# Patient Record
Sex: Female | Born: 1975 | Race: White | Hispanic: No | Marital: Married | Smoking: Former smoker
Health system: Southern US, Community
[De-identification: ages and names within clinical notes are randomized; demographics above are authoritative.]

## PROBLEM LIST (undated history)

## (undated) DIAGNOSIS — F411 Generalized anxiety disorder: Secondary | ICD-10-CM

## (undated) DIAGNOSIS — D649 Anemia, unspecified: Secondary | ICD-10-CM

## (undated) DIAGNOSIS — G47419 Narcolepsy without cataplexy: Secondary | ICD-10-CM

## (undated) DIAGNOSIS — F32A Depression, unspecified: Secondary | ICD-10-CM

## (undated) DIAGNOSIS — T7840XA Allergy, unspecified, initial encounter: Secondary | ICD-10-CM

## (undated) DIAGNOSIS — F329 Major depressive disorder, single episode, unspecified: Secondary | ICD-10-CM

## (undated) DIAGNOSIS — G4761 Periodic limb movement disorder: Secondary | ICD-10-CM

## (undated) DIAGNOSIS — M1990 Unspecified osteoarthritis, unspecified site: Secondary | ICD-10-CM

## (undated) HISTORY — DX: Generalized anxiety disorder: F41.1

## (undated) HISTORY — DX: Periodic limb movement disorder: G47.61

## (undated) HISTORY — DX: Depression, unspecified: F32.A

## (undated) HISTORY — PX: DILATION AND CURETTAGE OF UTERUS: SHX78

## (undated) HISTORY — DX: Anemia, unspecified: D64.9

## (undated) HISTORY — DX: Allergy, unspecified, initial encounter: T78.40XA

## (undated) HISTORY — DX: Unspecified osteoarthritis, unspecified site: M19.90

## (undated) HISTORY — PX: ROOT CANAL: SHX2363

## (undated) HISTORY — DX: Major depressive disorder, single episode, unspecified: F32.9

## (undated) HISTORY — DX: Narcolepsy without cataplexy: G47.419

## (undated) HISTORY — PX: NASAL SEPTUM SURGERY: SHX37

---

## 2011-01-17 ENCOUNTER — Encounter: Payer: Self-pay | Admitting: Family Medicine

## 2016-02-14 ENCOUNTER — Telehealth: Payer: Self-pay | Admitting: Behavioral Health

## 2016-02-14 NOTE — Telephone Encounter (Signed)
Unable to reach patient at time of Pre-Visit Call.  Left message for patient to return call when available.    

## 2016-02-15 ENCOUNTER — Ambulatory Visit (INDEPENDENT_AMBULATORY_CARE_PROVIDER_SITE_OTHER): Payer: BLUE CROSS/BLUE SHIELD | Admitting: Family Medicine

## 2016-02-15 ENCOUNTER — Encounter: Payer: Self-pay | Admitting: Family Medicine

## 2016-02-15 VITALS — BP 110/60 | HR 73 | Temp 98.2°F | Ht 65.0 in | Wt 133.6 lb

## 2016-02-15 DIAGNOSIS — Z13220 Encounter for screening for lipoid disorders: Secondary | ICD-10-CM | POA: Diagnosis not present

## 2016-02-15 DIAGNOSIS — Z7689 Persons encountering health services in other specified circumstances: Secondary | ICD-10-CM

## 2016-02-15 DIAGNOSIS — Z7189 Other specified counseling: Secondary | ICD-10-CM | POA: Diagnosis not present

## 2016-02-15 DIAGNOSIS — R002 Palpitations: Secondary | ICD-10-CM

## 2016-02-15 DIAGNOSIS — F411 Generalized anxiety disorder: Secondary | ICD-10-CM

## 2016-02-15 DIAGNOSIS — Z131 Encounter for screening for diabetes mellitus: Secondary | ICD-10-CM

## 2016-02-15 HISTORY — DX: Generalized anxiety disorder: F41.1

## 2016-02-15 LAB — TSH: TSH: 1.48 u[IU]/mL (ref 0.35–4.50)

## 2016-02-15 LAB — COMPREHENSIVE METABOLIC PANEL
ALK PHOS: 41 U/L (ref 39–117)
ALT: 10 U/L (ref 0–35)
AST: 13 U/L (ref 0–37)
Albumin: 4.3 g/dL (ref 3.5–5.2)
BILIRUBIN TOTAL: 0.3 mg/dL (ref 0.2–1.2)
BUN: 10 mg/dL (ref 6–23)
CO2: 30 mEq/L (ref 19–32)
Calcium: 8.9 mg/dL (ref 8.4–10.5)
Chloride: 103 mEq/L (ref 96–112)
Creatinine, Ser: 0.7 mg/dL (ref 0.40–1.20)
GFR: 98.02 mL/min (ref >60.00)
GLUCOSE: 86 mg/dL (ref 70–99)
Potassium: 4.2 mEq/L (ref 3.5–5.1)
SODIUM: 138 meq/L (ref 135–145)
TOTAL PROTEIN: 7.3 g/dL (ref 6.0–8.3)

## 2016-02-15 LAB — CBC
HCT: 35.3 % — ABNORMAL LOW (ref 36.0–46.0)
Hemoglobin: 12 g/dL (ref 12.0–15.0)
MCHC: 33.9 g/dL (ref 30.0–36.0)
MCV: 89.2 fl (ref 78.0–100.0)
Platelets: 256 10*3/uL (ref 150.0–400.0)
RBC: 3.96 Mil/uL (ref 3.87–5.11)
RDW: 13.3 % (ref 11.5–15.5)
WBC: 5.8 10*3/uL (ref 4.0–10.5)

## 2016-02-15 LAB — LIPID PANEL
Cholesterol: 151 mg/dL (ref 0–200)
HDL: 57 mg/dL (ref >39.00)
LDL Cholesterol: 81 mg/dL (ref 0–99)
NONHDL: 94.09
Total CHOL/HDL Ratio: 3
Triglycerides: 63 mg/dL (ref 0.0–149.0)
VLDL: 12.6 mg/dL (ref 0.0–40.0)

## 2016-02-15 LAB — HEMOGLOBIN A1C: Hgb A1c MFr Bld: 5.5 % (ref 4.6–6.5)

## 2016-02-15 MED ORDER — ESCITALOPRAM OXALATE 20 MG PO TABS: 20.0000 mg | ORAL_TABLET | Freq: Every day | ORAL | Status: DC

## 2016-02-15 NOTE — Patient Instructions (Addendum)
It was very nice to meet you today I will be in touch with your labs asap Take care and let me know if you have any changes in your palpitations but I suspect they are nothing to worry about - your EKG looks just fine.

## 2016-02-15 NOTE — Progress Notes (Signed)
Pre visit review using our clinic tool,if applicable. No additional management support is needed unless otherwise documented below in the visit note.  

## 2016-02-15 NOTE — Progress Notes (Signed)
[place] at [facility] [address] [place], [place] [address] [phone] [phone]  Date:  02/15/2016   Name:  Elizabeth [name]   DOB:  06/15/1976   MRN:  [id number]  PCP:  Lamar Blinks, MD    Chief Complaint: No chief complaint on file.   History of Present Illness:  Elizabeth [name] is a 40 y.o. very pleasant female patient who presents with the following:  Here today to establish care.  They moved to this area about one year ago.  Had lived in [place] until last year.  She is generally in good health She does uses alprazolam, wellbutrin and lexapro for anxiety.   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   She is a Pharmacist, hospital. Her children are 28 and 44 yo.   At the current time her anxiety regimen is working well for her.  She does not really like taking medications but recognized  She uses the xanax on a prn basis- notes that she will use it once a week or so  She has noted some occasional heart palpitations - they will last for a few seconds.  Does not occur every day No fainting or chest pain.  Non exertional No history of any heart problems known Her LMP was about 2 weeks ago.   There are no active problems to display for this patient.   Past Medical History  Diagnosis Date  . Depression   . Allergy   . Anemia   . Arthritis     Past Surgical History  Procedure Laterality Date  . Cesarean section      X2  . Dilation and curettage of uterus      Social History  Substance Use Topics  . Smoking status: Never Smoker   . Smokeless tobacco: Never Used  . Alcohol Use: No    Family History  Problem Relation Age of Onset  . Heart disease Mother   . Diabetes Father     Allergies  Allergen Reactions  . Sulfa Antibiotics     Medication list has been reviewed and updated.  No  current outpatient prescriptions on file prior to visit.   No current facility-administered medications on file prior to visit.    Review of Systems:  As per HPI- otherwise negative.   Physical Examination: Filed Vitals:   02/15/16 1034  Pulse: 73  Temp: 98.2 F (36.8 C)   Filed Vitals:   02/15/16 1034  Height: 5\' 5"  (1.651 m)  Weight: 133 lb 9.6 oz (60.601 kg)   Body mass index is 22.23 kg/(m^2). Ideal Body Weight: Weight in (lb) to have BMI = 25: 149.9  GEN: WDWN, NAD, Non-toxic, A & O x 3, looks well, normal weight  HEENT: Atraumatic, Normocephalic. Neck supple. No masses, No LAD.  Bilateral TM wnl, oropharynx normal.  PEERL,EOMI.   Ears and Nose: No external deformity. CV: RRR, No M/G/R. No JVD. No thrill. No extra heart sounds. PULM: CTA B, no wheezes, crackles, rhonchi. No retractions. No resp. distress. No accessory muscle use. ABD: S, NT, ND. No rebound. No HSM. EXTR: No c/c/e NEURO Normal gait.  PSYCH: Normally interactive. Conversant. Not depressed or anxious appearing.  Calm demeanor.   EKG: normal sinus rhythm Assessment and Plan: Palpitations - Plan: EKG 12-Lead, CBC, TSH  Establishing care  with new doctor, encounter for  Screening for hyperlipidemia - Plan: Lipid panel  Screening for diabetes mellitus - Plan: Comprehensive metabolic panel, Hemoglobin A1c  Anxiety state - Plan: escitalopram (LEXAPRO) 20 MG tablet  Here today to establish care Refilled her lexapro- reassured that her regimen is appropriate to use and may be continued as long as she feels it is necessary.  Suspect she is having occasional PACs or PVCs.  Reassured that her EKG is normal.  If she has any more persistent sx she will seek care Screening labs as above- Will plan further follow- up pending labs.   Signed Lamar Blinks, MD

## 2016-02-16 ENCOUNTER — Encounter: Payer: Self-pay | Admitting: Family Medicine

## 2016-03-28 ENCOUNTER — Encounter: Payer: Self-pay | Admitting: Family Medicine

## 2016-03-28 ENCOUNTER — Ambulatory Visit (INDEPENDENT_AMBULATORY_CARE_PROVIDER_SITE_OTHER): Payer: BLUE CROSS/BLUE SHIELD | Admitting: Family Medicine

## 2016-03-28 VITALS — BP 120/63 | HR 63 | Temp 98.2°F | Ht 65.0 in | Wt 134.4 lb

## 2016-03-28 DIAGNOSIS — J3489 Other specified disorders of nose and nasal sinuses: Secondary | ICD-10-CM | POA: Diagnosis not present

## 2016-03-28 MED ORDER — PREDNISONE 20 MG PO TABS: ORAL_TABLET | ORAL | Status: DC

## 2016-03-28 MED ORDER — AMOXICILLIN 500 MG PO CAPS: 1000.0000 mg | ORAL_CAPSULE | Freq: Two times a day (BID) | ORAL | Status: DC

## 2016-03-28 NOTE — Patient Instructions (Signed)
We are going to treat you for sinus inflammation and pain with prednisone - use as directed.  While on prednisone do not take ibuprofen/ aleve, but tylenol is ok!    If you feel like your symptoms are not improving in the next 2-3 days go ahead and start the amoxicillin antibiotic as well Let me know if you have any fevers or other sign of worsening illness, or if you are not much improved within about one week

## 2016-03-28 NOTE — Progress Notes (Signed)
[place] at [facility] [address] [place], [place] [address] [phone]  Date:  03/28/2016   Name:  Elizabeth [name]   DOB:  1976-03-23   MRN:  [id number]  PCP:  Lamar Blinks, MD    Chief Complaint: Sinus Problem   History of Present Illness:  Elizabeth [name] is a 40 y.o. very pleasant female patient who presents with the following:  She has noted sinus congestion this week and does tend to have seasonal allergies this time of year at baseline.  Starting yesterday she noted "waves of pain" in her sinuses with pressure in her face and in her teeth.  The pain would come and go Last night she took an allergy pill (non- decongestant) and some ibuprofen; this finally did seem to help her get to sleep.  However today the pain and pressure in her sinuses continues   LMP 5/10  She has not noted any ST.  Her ears can be painful.  She has not noted any fever, chills or aches, she is not coughing No GI symptoms  No suspicion of pregnancy Her youngest daughter had a stomach bug recently but otherwise no exposure to illness    Patient Active Problem List   Diagnosis Date Noted  . Anxiety state 02/15/2016    Past Medical History  Diagnosis Date  . Depression   . Allergy   . Anemia   . Arthritis   . Anxiety state 02/15/2016    Past Surgical History  Procedure Laterality Date  . Cesarean section      X2  . Dilation and curettage of uterus      Social History  Substance Use Topics  . Smoking status: Never Smoker   . Smokeless tobacco: Never Used  . Alcohol Use: No    Family History  Problem Relation Age of Onset  . Heart disease Mother   . Diabetes Father     Allergies  Allergen Reactions  . Sulfa Antibiotics     Medication list has been reviewed and updated.  Current Outpatient Prescriptions on File Prior to Visit  Medication Sig Dispense Refill  . ALPRAZolam (NIRAVAM) 0.25 MG dissolvable tablet Take 0.25  mg by mouth 2 (two) times daily as needed for anxiety.    Mar[place] Kitchen buPROPion (ZYBAN) 150 MG 12 hr tablet Take 150 mg by mouth daily.    Marland Kitchen escitalopram (LEXAPRO) 20 MG tablet Take 1 tablet (20 mg total) by mouth daily. 90 tablet 3   No current facility-administered medications on file prior to visit.    Review of Systems:  As per HPI- otherwise negative. No history of DM or of mania  Physical Examination: Filed Vitals:   03/28/16 1100  BP: 120/63  Pulse: 63  Temp: 98.2 F (36.8 C)   Filed Vitals:   03/28/16 1100  Height: 5\' 5"  (1.651 m)  Weight: 134 lb 6.4 oz (60.963 kg)   Body mass index is 22.37 kg/(m^2). Ideal Body Weight: Weight in (lb) to have BMI = 25: 149.9  GEN: WDWN, NAD, Non-toxic, A & O x 3, looks well HEENT: Atraumatic, Normocephalic. Neck supple. No masses, No LAD.  Bilateral TM wnl, oropharynx normal.  PEERL,EOMI.   Sinuses are tender to percussion, nasal congestion is present Ears and Nose: No external deformity. CV: RRR, No M/G/R. No JVD. No thrill. No extra heart sounds. PULM: CTA B, no wheezes, crackles, rhonchi. No retractions. No resp. distress. No accessory muscle use. EXTR: No  c/c/e NEURO Normal gait.  PSYCH: Normally interactive. Conversant. Not depressed or anxious appearing.  Calm demeanor.    Assessment and Plan: Sinus pressure - Plan: predniSONE (DELTASONE) 20 MG tablet, amoxicillin (AMOXIL) 500 MG capsule  Treat for sinus pressure and pain with a short course of prednsione If this is not helpful she will start amoxicillin in a few days See patient instructions for more details.     Signed Lamar Blinks, MD

## 2016-03-28 NOTE — Progress Notes (Signed)
Pre visit review using our clinic review tool, if applicable. No additional management support is needed unless otherwise documented below in the visit note. 

## 2016-05-20 ENCOUNTER — Telehealth: Payer: Self-pay

## 2016-05-20 ENCOUNTER — Other Ambulatory Visit: Payer: Self-pay | Admitting: Emergency Medicine

## 2016-05-20 DIAGNOSIS — F411 Generalized anxiety disorder: Secondary | ICD-10-CM

## 2016-05-20 NOTE — Telephone Encounter (Signed)
Patient need refill for Zyban, patient needs rx for 90 days so insurance will cover Pharmacy is [facility] [address] [phone]

## 2016-05-21 MED ORDER — BUPROPION HCL ER (XL) 150 MG PO TB24: 150.0000 mg | ORAL_TABLET | Freq: Every day | ORAL | 3 refills | Status: DC

## 2016-05-21 NOTE — Telephone Encounter (Signed)
Received refill request for Zyban. Pt last seen 03/28/16. Is it ok to refill? Please advise. Thanks.

## 2016-05-21 NOTE — Telephone Encounter (Signed)
Called pt- she is taking a 150mg  dose just once a day.  Likely needs to be changed to the 24 hour sustained release.  Will change this for her and refill

## 2016-09-18 ENCOUNTER — Ambulatory Visit (INDEPENDENT_AMBULATORY_CARE_PROVIDER_SITE_OTHER): Payer: BLUE CROSS/BLUE SHIELD | Admitting: Podiatry

## 2016-09-18 ENCOUNTER — Encounter: Payer: Self-pay | Admitting: Podiatry

## 2016-09-18 VITALS — BP 123/79 | HR 70 | Ht 65.0 in | Wt 133.0 lb

## 2016-09-18 DIAGNOSIS — M79673 Pain in unspecified foot: Secondary | ICD-10-CM

## 2016-09-18 DIAGNOSIS — M21969 Unspecified acquired deformity of unspecified lower leg: Secondary | ICD-10-CM

## 2016-09-18 DIAGNOSIS — M7741 Metatarsalgia, right foot: Secondary | ICD-10-CM

## 2016-09-18 NOTE — Patient Instructions (Signed)
Seen for painful toe 4th right. Noted of abnormal, weak first metatarsal bone and abnormal weight shifting to lateral column and causing lesser toes to curl. May benefit from custom orthotics, lace up tennis shoes and metatarsal binder. Metatarsal binder 1 pair (sm) dispensed. Return for custom orthotics.

## 2016-09-18 NOTE — Progress Notes (Signed)
SUBJECTIVE: 40 y.o. year old female presents complaining of pain off and on both feet with a bump in 4th toe right foot.  Sometimes lesser toes go numb when in high heel shoes. Has lowe back arthritis issues. Usually have multiple joint problems.  Moved from [place] 18 months ago and seen a podiatrist in [place].   REVIEW OF SYSTEMS: A comprehensive review of systems was negative.  OBJECTIVE: DERMATOLOGIC EXAMINATION: No abnormal findings. VASCULAR EXAMINATION OF LOWER LIMBS: All pedal pulses are palpable with normal pulsation.  Capillary Filling times within 3 seconds in all digits.  Temperature gradient from tibial crest to dorsum of foot is within normal bilateral. NEUROLOGIC EXAMINATION OF THE LOWER LIMBS: All epicritic and tactile sensations grossly intact. MUSCULOSKELETAL EXAMINATION: Positive for excess sagittal plane motion of the first ray bilateral. Contracted 4th digit at DIPJ with enlarged condyle.  ASSESSMENT: Painful feet. Hypermobile first ray bilateral. Lesser metatarsalgia bilateral.  PLAN: Reviewed clinical findings and available treatment options. Metatarsal binder dispensed x 1, small for right foot. May benefit from custom orthotics.

## 2016-09-20 DIAGNOSIS — M7741 Metatarsalgia, right foot: Secondary | ICD-10-CM | POA: Insufficient documentation

## 2016-10-10 ENCOUNTER — Ambulatory Visit (INDEPENDENT_AMBULATORY_CARE_PROVIDER_SITE_OTHER): Payer: BLUE CROSS/BLUE SHIELD | Admitting: Family Medicine

## 2016-10-10 ENCOUNTER — Encounter: Payer: Self-pay | Admitting: Family Medicine

## 2016-10-10 VITALS — BP 126/70 | HR 64 | Temp 98.3°F | Ht 65.0 in | Wt 133.8 lb

## 2016-10-10 DIAGNOSIS — J0111 Acute recurrent frontal sinusitis: Secondary | ICD-10-CM

## 2016-10-10 MED ORDER — AMOXICILLIN-POT CLAVULANATE 875-125 MG PO TABS: 1.0000 | ORAL_TABLET | Freq: Two times a day (BID) | ORAL | 0 refills | Status: DC

## 2016-10-10 NOTE — Progress Notes (Signed)
Pre visit review using our clinic review tool, if applicable. No additional management support is needed unless otherwise documented below in the visit note. 

## 2016-10-10 NOTE — Patient Instructions (Signed)
We are going to use augmentin for your sinus infection.  Take this twice a day for 7- 10 days; let me know if you are not getting better in the next couple of days., Sooner if worse.

## 2016-10-10 NOTE — Progress Notes (Signed)
[place] at [facility] [address], [place], [place] [address] [phone] [id number] [phone]  Date:  10/10/2016   Name:  Elizabeth [name]   DOB:  09-11-1976   MRN:  [id number]  PCP:  Lamar Blinks, MD    Chief Complaint: Sinusitis (c/o sinus pressure and pain. pt states that she also has been in her teeth. sx's have been present for about 3-4 days, worse today. )   History of Present Illness:  Elizabeth [name] is a 40 y.o. very pleasant female patient who presents with the following:  Seen here in June with sinus pressure- we treated her with prednisone and gave an rx to hold of  amoxicillin- however she did not end up using the amox as her dentist gave her augmentin. The prednisone did not help, but the augmentin did help.  She was having a lot of tooth pain so she went to her dentist who did x-rays and noted severe sinusitis, treated her with augmetin  She is here today with a sick visit- she states that she has "the same exact thing that I came for last time." She has been ill for 3-4 days with sinus pressure, tooth pain, facial pain, headahce and toothache No chest sx No fever or chills.  Slight ST No GI symptoms  Minimal cough  LMP 2 weeks ago approx   Patient Active Problem List   Diagnosis Date Noted  . Metatarsalgia of right foot 09/20/2016  . Anxiety state 02/15/2016    Past Medical History:  Diagnosis Date  . Allergy   . Anemia   . Anxiety state 02/15/2016  . Arthritis   . Depression     Past Surgical History:  Procedure Laterality Date  . CESAREAN SECTION     X2  . DILATION AND CURETTAGE OF UTERUS      Social History  Substance Use Topics  . Smoking status: Former Smoker    Quit date: 10/21/2004  . Smokeless tobacco: Never Used     Comment: quit 11 yrs ago  . Alcohol use No    Family History  Problem Relation Age of Onset  . Heart disease Mother   . Diabetes Father     Allergies  Allergen Reactions  . Sulfa  Antibiotics     Medication list has been reviewed and updated.  Current Outpatient Prescriptions on File Prior to Visit  Medication Sig Dispense Refill  . buPROPion (WELLBUTRIN XL) 150 MG 24 hr tablet Take 1 tablet (150 mg total) by mouth daily. 90 tablet 3  . escitalopram (LEXAPRO) 20 MG tablet Take 1 tablet (20 mg total) by mouth daily. 90 tablet 3   No current facility-administered medications on file prior to visit.     Review of Systems:  As per HPI- otherwise negative.  No CP or SOB She is otherwise in good health    Physical Examination: Vitals:   10/10/16 1818  BP: 126/70  Pulse: 64  Temp: 98.3 F (36.8 C)   Vitals:   10/10/16 1818  Weight: 133 lb 12.8 oz (60.7 kg)  Height: 5\' 5"  (1.651 m)   Body mass index is 22.27 kg/m. Ideal Body Weight: Weight in (lb) to have BMI = 25: 149.9  GEN: WDWN, NAD, Non-toxic, A & O x 3, looks well HEENT: Atraumatic, Normocephalic. Neck supple. No masses, No LAD.  Bilateral TM wnl, oropharynx normal.  PEERL,EOMI.   Tender in her frontal sinuses., nasal cavity is congested with purulent discharge  Ears and Nose: No external deformity. CV: RRR, No M/G/R. No JVD. No thrill. No extra heart sounds. PULM: CTA B, no wheezes, crackles, rhonchi. No retractions. No resp. distress. No accessory muscle use. EXTR: No c/c/e NEURO Normal gait.  PSYCH: Normally interactive. Conversant. Not depressed or anxious appearing.  Calm demeanor.    Assessment and Plan: Acute recurrent frontal sinusitis - Plan: amoxicillin-clavulanate (AUGMENTIN) 875-125 MG tablet  Here today with recurrent sinusitis Treat with augmentin BID for 7- 10 days She will let me know if not feeling better in the next few days   Signed Lamar Blinks, MD

## 2016-10-25 ENCOUNTER — Encounter: Payer: Self-pay | Admitting: Family Medicine

## 2016-10-25 ENCOUNTER — Ambulatory Visit (INDEPENDENT_AMBULATORY_CARE_PROVIDER_SITE_OTHER): Payer: BLUE CROSS/BLUE SHIELD | Admitting: Family Medicine

## 2016-10-25 VITALS — BP 118/76 | HR 78 | Temp 98.2°F | Ht 65.0 in | Wt 138.2 lb

## 2016-10-25 DIAGNOSIS — N3001 Acute cystitis with hematuria: Secondary | ICD-10-CM | POA: Diagnosis not present

## 2016-10-25 LAB — POC URINALSYSI DIPSTICK (AUTOMATED)
BILIRUBIN UA: NEGATIVE
Blood, UA: POSITIVE
GLUCOSE UA: NEGATIVE
KETONES UA: NEGATIVE
LEUKOCYTES UA: NEGATIVE
NITRITE UA: NEGATIVE
Protein, UA: NEGATIVE
Spec Grav, UA: >=1.03
Urobilinogen, UA: 0.2
pH, UA: 6

## 2016-10-25 MED ORDER — NITROFURANTOIN MONOHYD MACRO 100 MG PO CAPS: 100.0000 mg | ORAL_CAPSULE | Freq: Two times a day (BID) | ORAL | 0 refills | Status: DC

## 2016-10-25 NOTE — Addendum Note (Signed)
Addended by: McDonald Cellar on: 10/25/2016 02:56 PM   Modules accepted: Orders

## 2016-10-25 NOTE — Progress Notes (Signed)
Pre visit review using our clinic review tool, if applicable. No additional management support is needed unless otherwise documented below in the visit note. 

## 2016-10-25 NOTE — Progress Notes (Signed)
Chief Complaint  Patient presents with  . Cystitis    Pt states she thinks she has a bladder infection /Pt reports bleeding after menstration and uncomforable after intercourse    Elizabeth [name] is a 41 y.o. female here for possible UTI.  Duration: 1 day. Symptoms: urinary frequency, hematuria and pain with urination. Denies: fever, nausea, vomiting and flank pain, vaginal discharge Hx of recurrent UTI? No Denies new sexual partners. Has had UTI's before, states this is the similar presentation  ROS:  Constitutional: denies fever GU: As noted in HPI MSK: Denies back pain Abd: Denies constipation or abdominal pain  Past Medical History:  Diagnosis Date  . Allergy   . Anemia   . Anxiety state 02/15/2016  . Arthritis   . Depression    Family History  Problem Relation Age of Onset  . Heart disease Mother   . Diabetes Father    Social History   Social History  . Marital status: Married   Social History Main Topics  . Smoking status: Former Smoker    Quit date: 10/21/2004  . Smokeless tobacco: Never Used     Comment: quit 11 yrs ago  . Alcohol use No  . Drug use: Unknown    BP 118/76 (BP Location: Right Arm, Patient Position: Sitting, Cuff Size: Small)   Pulse 78   Temp 98.2 F (36.8 C) (Oral)   Ht 5\' 5"  (1.651 m)   Wt 138 lb 3.2 oz (62.7 kg)   LMP 10/11/2016   SpO2 98%   BMI 23.00 kg/m  General: Awake, alert, appears stated age 63: MMM Heart: RRR, no murmurs Lungs: CTAB, normal respiratory effort, no accessory muscle usage Abd: BS+, soft, NT, ND, no masses or organomegaly MSK: No CVA tenderness, neg Lloyd's sign Psych: Age appropriate judgment and insight  Acute cystitis with hematuria - Plan: nitrofurantoin, macrocrystal-monohydrate, (MACROBID) 100 MG capsule  Orders as above. UA not quite suggestive of infection, will treat given her hx of previous UTI's presenting like this. Will culture for completeness. F/u in 3 days if symptoms fail to improve,  sooner if things worsen. The patient voiced understanding and agreement to the plan.  Hodgeman, DO 10/25/16 2:52 PM

## 2016-11-21 DIAGNOSIS — J329 Chronic sinusitis, unspecified: Secondary | ICD-10-CM | POA: Diagnosis not present

## 2016-11-21 DIAGNOSIS — J342 Deviated nasal septum: Secondary | ICD-10-CM | POA: Diagnosis not present

## 2016-11-27 ENCOUNTER — Other Ambulatory Visit: Payer: Self-pay | Admitting: Otolaryngology

## 2016-11-27 DIAGNOSIS — J329 Chronic sinusitis, unspecified: Secondary | ICD-10-CM

## 2016-11-28 DIAGNOSIS — L509 Urticaria, unspecified: Secondary | ICD-10-CM | POA: Diagnosis not present

## 2016-11-28 DIAGNOSIS — R208 Other disturbances of skin sensation: Secondary | ICD-10-CM | POA: Diagnosis not present

## 2016-12-11 ENCOUNTER — Ambulatory Visit
Admission: RE | Admit: 2016-12-11 | Discharge: 2016-12-11 | Disposition: A | Discharge status: Home / Self Care | Payer: BLUE CROSS/BLUE SHIELD | Source: Ambulatory Visit | Attending: Otolaryngology | Admitting: Otolaryngology

## 2016-12-11 DIAGNOSIS — J329 Chronic sinusitis, unspecified: Secondary | ICD-10-CM

## 2016-12-25 DIAGNOSIS — J342 Deviated nasal septum: Secondary | ICD-10-CM | POA: Diagnosis not present

## 2017-02-06 ENCOUNTER — Encounter: Payer: Self-pay | Admitting: Family Medicine

## 2017-02-06 ENCOUNTER — Ambulatory Visit (INDEPENDENT_AMBULATORY_CARE_PROVIDER_SITE_OTHER): Payer: BLUE CROSS/BLUE SHIELD | Admitting: Family Medicine

## 2017-02-06 VITALS — BP 124/78 | HR 73 | Temp 98.5°F | Ht 65.0 in | Wt 135.8 lb

## 2017-02-06 DIAGNOSIS — Z130 Encounter for screening for diseases of the blood and blood-forming organs and certain disorders involving the immune mechanism: Secondary | ICD-10-CM | POA: Diagnosis not present

## 2017-02-06 DIAGNOSIS — N898 Other specified noninflammatory disorders of vagina: Secondary | ICD-10-CM | POA: Diagnosis not present

## 2017-02-06 DIAGNOSIS — R5382 Chronic fatigue, unspecified: Secondary | ICD-10-CM | POA: Diagnosis not present

## 2017-02-06 DIAGNOSIS — G4710 Hypersomnia, unspecified: Secondary | ICD-10-CM

## 2017-02-06 LAB — COMPREHENSIVE METABOLIC PANEL
ALBUMIN: 4.3 g/dL (ref 3.5–5.2)
ALT: 8 U/L (ref 0–35)
AST: 13 U/L (ref 0–37)
Alkaline Phosphatase: 44 U/L (ref 39–117)
BUN: 10 mg/dL (ref 6–23)
CHLORIDE: 103 meq/L (ref 96–112)
CO2: 31 mEq/L (ref 19–32)
Calcium: 9 mg/dL (ref 8.4–10.5)
Creatinine, Ser: 0.77 mg/dL (ref 0.40–1.20)
GFR: 87.4 mL/min (ref >60.00)
Glucose, Bld: 88 mg/dL (ref 70–99)
POTASSIUM: 3.8 meq/L (ref 3.5–5.1)
Sodium: 140 mEq/L (ref 135–145)
Total Bilirubin: 0.3 mg/dL (ref 0.2–1.2)
Total Protein: 7 g/dL (ref 6.0–8.3)

## 2017-02-06 LAB — CBC
HCT: 34.2 % — ABNORMAL LOW (ref 36.0–46.0)
Hemoglobin: 11.7 g/dL — ABNORMAL LOW (ref 12.0–15.0)
MCHC: 34.1 g/dL (ref 30.0–36.0)
MCV: 91.1 fl (ref 78.0–100.0)
PLATELETS: 258 10*3/uL (ref 150.0–400.0)
RBC: 3.76 Mil/uL — AB (ref 3.87–5.11)
RDW: 13.2 % (ref 11.5–15.5)
WBC: 5.8 10*3/uL (ref 4.0–10.5)

## 2017-02-06 LAB — TSH: TSH: 1.33 u[IU]/mL (ref 0.35–4.50)

## 2017-02-06 LAB — FOLLICLE STIMULATING HORMONE: FSH: 66 m[IU]/mL

## 2017-02-06 LAB — VITAMIN D 25 HYDROXY (VIT D DEFICIENCY, FRACTURES): VITD: 25.96 ng/mL — ABNORMAL LOW (ref 30.00–100.00)

## 2017-02-06 NOTE — Progress Notes (Addendum)
[place] at [facility] [address] [place], [place] [address] [phone]  Date:  02/06/2017   Name:  Elizabeth [name]   DOB:  05/06/1976   MRN:  [id number]  PCP:  Lamar Blinks, MD    Chief Complaint: Fatigue (c/o feeling fatigued. pt states that sx's have been present for a long time but worse this past week. )   History of Present Illness:  Elizabeth [name] is a 41 y.o. very pleasant female patient who presents with the following:  Last seen by myself in December with sinusitis Otherwise she is generally healthy. Here today with concern of increasing fatigue; worse for the last 4-5 months.  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  They are not sure if this might be contributing to her fatigue.  They will do this surgery in June.    Her most recent labs were back in 4/17 She has not had a sleep study  She gets up around 0600, 4-5 hours later she will feel very tired and needs a nap.  Then she will be able to go another 5-6 hours. She may take another nap in the afternoon sometimes.  She will get to bed around 10- 11 pm  She does snore but not badly according to her husband She notes that even after coffee she does not feel awake in the morning She does not so much think that depression is her issue.  No chance of pregnancy- her last menses were about a month ago She does notice some vaginal dryness and wonders if she might have a hormonal issue- ?early menopause  Patient Active Problem List   Diagnosis Date Noted  . Metatarsalgia of right foot 09/20/2016  . Anxiety state 02/15/2016    Past Medical History:  Diagnosis Date  . Allergy   . Anemia   . Anxiety state 02/15/2016  .  Arthritis   . Depression     Past Surgical History:  Procedure Laterality Date  . CESAREAN SECTION     X2  . DILATION AND CURETTAGE OF UTERUS      Social History  Substance Use Topics  . Smoking status: Former Smoker    Quit date: 10/21/2004  . Smokeless tobacco: Never Used     Comment: quit 11 yrs ago  . Alcohol use No    Family History  Problem Relation Age of Onset  . Heart disease Mother   . Diabetes Father     Allergies  Allergen Reactions  . Sulfa Antibiotics     Medication list has been reviewed and updated.  Current Outpatient Prescriptions on File Prior to Visit  Medication Sig Dispense Refill  . buPROPion (WELLBUTRIN XL) 150 MG 24 hr tablet Take 1 tablet (150 mg total) by mouth daily. 90 tablet 3  . escitalopram (LEXAPRO) 20 MG tablet Take 1 tablet (20 mg total) by mouth daily. 90 tablet 3   No current facility-administered medications on file prior to visit.     Review of Systems:  As per HPI- otherwise negative.   Physical Examination: Vitals:   02/06/17 1402  BP: 124/78  Pulse: 73  Temp: 98.5 F (36.9 C)  Vitals:   02/06/17 1402  Weight: 135 lb 12.8 oz (61.6 kg)  Height: 5\' 5"  (1.651 m)   Body mass index is 22.6 kg/m. Ideal Body Weight: Weight in (lb) to have BMI = 25: 149.9  GEN: WDWN, NAD, Non-toxic, A & O x 3, normal weight, looks well HEENT: Atraumatic, Normocephalic. Neck supple. No masses, No LAD.  Bilateral TM wnl, oropharynx normal.  PEERL,EOMI.   Ears and Nose: No external deformity. CV: RRR, No M/G/R. No JVD. No thrill. No extra heart sounds. PULM: CTA B, no wheezes, crackles, rhonchi. No retractions. No resp. distress. No accessory muscle use. ABD: S, NT, ND, +BS. No rebound. No HSM. EXTR: No c/c/e NEURO Normal gait.  PSYCH: Normally interactive. Conversant. Not depressed or anxious appearing.  Calm demeanor.    Assessment and Plan: Excessive sleepiness - Plan: Ambulatory referral to Neurology  Screening for  deficiency anemia - Plan: CBC  Chronic fatigue - Plan: Comprehensive metabolic panel, TSH, Vitamin D (25 hydroxy)  Vaginal dryness - Plan: FSH here today with complaint of excessive sleepiness. She is otherwise in good health Will obtain labs as above but suspect this may be a sleep disorder Referral to neurology placed as well    Signed Lamar Blinks, MD  Received her labs  Results for orders placed or performed in visit on 02/06/17  CBC  Result Value Ref Range   WBC 5.8 4.0 - 10.5 K/uL   RBC 3.76 (L) 3.87 - 5.11 Mil/uL   Platelets 258.0 150.0 - 400.0 K/uL   Hemoglobin 11.7 (L) 12.0 - 15.0 g/dL   HCT 34.2 (L) 36.0 - 46.0 %   MCV 91.1 78.0 - 100.0 fl   MCHC 34.1 30.0 - 36.0 g/dL   RDW 13.2 11.5 - 15.5 %  Comprehensive metabolic panel  Result Value Ref Range   Sodium 140 135 - 145 mEq/L   Potassium 3.8 3.5 - 5.1 mEq/L   Chloride 103 96 - 112 mEq/L   CO2 31 19 - 32 mEq/L   Glucose, Bld 88 70 - 99 mg/dL   BUN 10 6 - 23 mg/dL   Creatinine, Ser 0.77 0.40 - 1.20 mg/dL   Total Bilirubin 0.3 0.2 - 1.2 mg/dL   Alkaline Phosphatase 44 39 - 117 U/L   AST 13 0 - 37 U/L   ALT 8 0 - 35 U/L   Total Protein 7.0 6.0 - 8.3 g/dL   Albumin 4.3 3.5 - 5.2 g/dL   Calcium 9.0 8.4 - 10.5 mg/dL   GFR 87.40 >60.00 mL/min  TSH  Result Value Ref Range   TSH 1.33 0.35 - 4.50 uIU/mL  Vitamin D (25 hydroxy)  Result Value Ref Range   VITD 25.96 (L) 30.00 - 100.00 ng/mL  [facility]  Result Value Ref Range   FSH 66.0 mIU/ML

## 2017-02-06 NOTE — Patient Instructions (Signed)
I am going to have you see neurology for evaluation of excessive sleepiness. We will also get labs today to look for any potential cause of your fatigue

## 2017-02-06 NOTE — Progress Notes (Signed)
Pre visit review using our clinic review tool, if applicable. No additional management support is needed unless otherwise documented below in the visit note. 

## 2017-02-20 ENCOUNTER — Encounter: Payer: Self-pay | Admitting: Family Medicine

## 2017-02-25 ENCOUNTER — Other Ambulatory Visit: Payer: Self-pay | Admitting: Family Medicine

## 2017-02-25 DIAGNOSIS — F411 Generalized anxiety disorder: Secondary | ICD-10-CM

## 2017-02-27 ENCOUNTER — Telehealth: Payer: Self-pay

## 2017-02-27 NOTE — Telephone Encounter (Signed)
I spoke to patient to r/s appointment on 5/17. Dr. Rexene Alberts will be out of the office at that time. Patient was unable to r/s at this time (she was driving) but she will call back to r/s.

## 2017-03-06 ENCOUNTER — Ambulatory Visit (INDEPENDENT_AMBULATORY_CARE_PROVIDER_SITE_OTHER): Payer: BLUE CROSS/BLUE SHIELD | Admitting: Neurology

## 2017-03-06 ENCOUNTER — Institutional Professional Consult (permissible substitution): Payer: BLUE CROSS/BLUE SHIELD | Admitting: Neurology

## 2017-03-06 ENCOUNTER — Encounter: Payer: Self-pay | Admitting: Neurology

## 2017-03-06 VITALS — BP 118/68 | HR 80 | Resp 14 | Ht 65.0 in | Wt 136.0 lb

## 2017-03-06 DIAGNOSIS — G4750 Parasomnia, unspecified: Secondary | ICD-10-CM

## 2017-03-06 DIAGNOSIS — R51 Headache: Secondary | ICD-10-CM

## 2017-03-06 DIAGNOSIS — F518 Other sleep disorders not due to a substance or known physiological condition: Secondary | ICD-10-CM | POA: Diagnosis not present

## 2017-03-06 DIAGNOSIS — G4710 Hypersomnia, unspecified: Secondary | ICD-10-CM | POA: Diagnosis not present

## 2017-03-06 DIAGNOSIS — R351 Nocturia: Secondary | ICD-10-CM

## 2017-03-06 DIAGNOSIS — R519 Headache, unspecified: Secondary | ICD-10-CM

## 2017-03-06 DIAGNOSIS — R6889 Other general symptoms and signs: Secondary | ICD-10-CM

## 2017-03-06 NOTE — Patient Instructions (Addendum)
I believe you may have an underlying sleepiness condition. This means, that you have a sleep disorder that manifests with excessive sleep and excessive sleepiness during the day.  We will do additional testing at this point: I would like for you to come back for an overnight sleep study during which we will monitor your night time sleep and we will do nap study testing the next day: 5 scheduled 20 min nap opportunities, every 2 hours. We will remind you to stay awake in between naps.   As explained, you will have to be off of any excess caffeine or stimulant or antidepressant medication in preparation for the sleep studies. You have to taper lexapro and Wellbutrin, and be off for at least 2 weeks prior  Talk to Dr. Lorelei Pont about coming off your meds for this.  Timing of the taper is key, try also to come off alcohol for sleep study testing.  Talk to your ENT about delaying your surgery.

## 2017-03-06 NOTE — Progress Notes (Signed)
Subjective:    Patient ID: Elizabeth [name] is a 41 y.o. female.  HPI     Star Age, MD, PhD [facility] [address], [address] P.O. [place], [place] [address]  Dear Dr. Lorelei Pont,   I saw your patient, Elizabeth [name], upon your kind request in my neurologic clinic today for initial consultation of her sleep disorder, in particular, excessive daytime somnolence for the past 6+ months. The patient is unaccompanied today. As you know, Elizabeth [name] is a 41 year old right-handed woman with an underlying medical history of allergies, anemia, depression, and anxiety, who reports a several year history of sleepiness, progressive, particularly worse in the past 6 months. She recalls being sleepy as a child even. She remembers falling asleep in class. She does not wake up with a sense of gasping. She has not been told that she has apneic breathing pauses. She does have nocturia once per night. She reports vivid dreams and a history of sleepwalking and sleep talking as well as night terrors. Her father has a history of parasomnias and vivid dreams as well. There is no family history of narcolepsy or sleep apnea. She suspects that her mom may have sleep apnea and needs to be tested for this. She denies restless leg symptoms. She has woken up occasionally with a headache. She tries to be in bed between 10:30 and 11. Wakeup time 6 AM. She has a difficult time waking up in the mornings. She feels like she has to take a nap on a daily basis. She will go to bed to take naps and sometimes sleeps for 1 or 2 hours. She reports having had dreams in her naps. She usually goes to her bedroom and takes a nap in her bed. She does not watch TV in her bedroom. At night, she has a difficult time going back to sleep once she is up. Sometimes it takes her an hour to fall asleep when she wants to take a nap. She does feel like she is tired enough to nap. She has dozed off at the wheel briefly. She  denies symptoms of cataplexy or sleep paralysis or hypnagogic or hypnopompic hallucinations. She does report vivid dreams, no acting out of her dreams. She has a longer standing history of anxiety disorder and was started on Wellbutrin and Lexapro 3 or 4 years ago which was very helpful. She and her family moved from [place] some 2 years ago. I reviewed your office note from 02/06/2017. Her Epworth sleepiness score is 15 out of 24 today, fatigue score is 55 out of 63. She lives at home with her husband and 2 daughters, ages 42 and 39. She home schools. She does not work outside the home. She quit smoking in 2005. She drinks alcohol in the form of wine, one glass per day. She drinks caffeine in the form of coffee, 2 cups per day, usually in the mornings. She has had intermittent snoring. She has a history of deviated septum and is scheduled to have septoplasty next month.  Her Past Medical History Is Significant For: Past Medical History:  Diagnosis Date  . Allergy   . Anemia   . Anxiety state 02/15/2016  . Arthritis   . Depression     Her Past Surgical History Is Significant For: Past Surgical History:  Procedure Laterality Date  . CESAREAN SECTION     X2  . DILATION AND CURETTAGE OF UTERUS    . NASAL SEPTUM SURGERY  Her Family History Is Significant For: Family History  Problem Relation Age of Onset  . Heart disease Mother   . Depression Mother   . Anxiety disorder Mother   . Diabetes Father   . Depression Father   . Anxiety disorder Father   . Stroke Maternal Grandmother   . Parkinsonism Paternal Grandmother   . Breast cancer Paternal Grandmother   . Bladder Cancer Paternal Grandfather     Her Social History Is Significant For: Social History   Social History  . Marital status: Married    Spouse name: N/A  . Number of children: 2  . Years of education: Masters   Social History Main Topics  . Smoking status: Former Smoker    Quit date: 10/21/2004  . Smokeless  tobacco: Never Used     Comment: quit 11 yrs ago  . Alcohol use No  . Drug use: Unknown  . Sexual activity: Not Asked   Other Topics Concern  . None   Social History Narrative   2 caffeine drinks a day     Her Allergies Are:  Allergies  Allergen Reactions  . Sulfa Antibiotics   :   Her Current Medications Are:  Outpatient Encounter Prescriptions as of 03/06/2017  Medication Sig  . buPROPion (WELLBUTRIN XL) 150 MG 24 hr tablet Take 1 tablet (150 mg total) by mouth daily.  . cetirizine (ZYRTEC) 10 MG chewable tablet Chew 10 mg by mouth daily.  Marland Kitchen escitalopram (LEXAPRO) 20 MG tablet TAKE 1 TABLET (20 MG TOTAL) BY MOUTH DAILY.  Marland Kitchen ibuprofen (ADVIL,MOTRIN) 200 MG tablet Take 200 mg by mouth as needed.   No facility-administered encounter medications on file as of 03/06/2017.   : Review of Systems:  Out of a complete 14 point review of systems, all are reviewed and negative with the exception of these symptoms as listed below:  Review of Systems  Neurological:       Patient has trouble falling and staying asleep, snores occasionally, wakes up feeling tired, occasional morning headache, daytime fatigue, takes naps during the day.   Epworth Sleepiness Scale 0= would never doze 1= slight chance of dozing 2= moderate chance of dozing 3= high chance of dozing  Sitting and reading:2 Watching TV:0 Sitting inactive in a public place (ex. Theater or meeting):3 As a passenger in a car for an hour without a break:2 Lying down to rest in the afternoon:3 Sitting and talking to someone:0 Sitting quietly after lunch (no alcohol):2 In a car, while stopped in traffic:3 Total:15   Objective:  Neurologic Exam  Physical Exam Physical Examination:   Vitals:   03/06/17 1541  BP: 118/68  Pulse: 80  Resp: 14   General Examination: The patient is a very pleasant 41 y.o. female in no acute distress. She appears well-developed and well-nourished and well groomed. Very mildly anxious  appearing.   HEENT: Normocephalic, atraumatic, pupils are equal, round and reactive to light and accommodation. Funduscopic exam is normal with sharp disc margins noted. Contact lenses in place. Extraocular tracking is good without limitation to gaze excursion or nystagmus noted. Normal smooth pursuit is noted. Hearing is grossly intact. Face is symmetric with normal facial animation and normal facial sensation. Speech is clear with no dysarthria noted. There is no hypophonia. There is no lip, neck/head, jaw or voice tremor. Neck is supple with full range of passive and active motion. There are no carotid bruits on auscultation. Oropharynx exam reveals: no significant mouth dryness, good dental hygiene and moderate  airway crowding, due to thicker and wider uvula, tonsils in place (small) and thicker looking tongue, smaller airway entry. Mallampati is class II. Tongue protrudes centrally and palate elevates symmetrically. Neck size is 13.5 inches. She has a Mild overbite. Nasal inspection reveals no significant nasal mucosal bogginess or redness but septal deviation to L.   Chest: Clear to auscultation without wheezing, rhonchi or crackles noted.  Heart: S1+S2+0, regular and normal without murmurs, rubs or gallops noted.   Abdomen: Soft, non-tender and non-distended with normal bowel sounds appreciated on auscultation.  Extremities: There is no pitting edema in the distal lower extremities bilaterally. Pedal pulses are intact.  Skin: Warm and dry without trophic changes noted.  Musculoskeletal: exam reveals no obvious joint deformities, tenderness or joint swelling or erythema.   Neurologically:  Mental status: The patient is awake, alert and oriented in all 4 spheres. Her immediate and remote memory, attention, language skills and fund of knowledge are appropriate. There is no evidence of aphasia, agnosia, apraxia or anomia. Speech is clear with normal prosody and enunciation. Thought process is  linear. Mood is normal and affect is normal.  Cranial nerves II - XII are as described above under HEENT exam. In addition: shoulder shrug is normal with equal shoulder height noted. Motor exam: Normal bulk, strength and tone is noted. There is no drift, tremor or rebound. Romberg is negative. Reflexes are 2+ throughout. Fine motor skills and coordination: intact with normal finger taps, normal hand movements, normal rapid alternating patting, normal foot taps and normal foot agility.  Cerebellar testing: No dysmetria or intention tremor on finger to nose testing. Heel to shin is unremarkable bilaterally. There is no truncal or gait ataxia.  Sensory exam: intact to light touch in the upper and lower extremities.  Gait, station and balance: She stands with no difficulty. No veering to one side is noted. No leaning to one side is noted. Posture is age-appropriate and stance is narrow based. Gait shows normal stride length and normal pace. No problems turning are noted. Tandem walk is unremarkable.   Assessment and Plan:  In summary, Elizabeth [name] is a very pleasant 41 y.o.-year old female with an underlying medical history of allergies, anemia, depression, and anxiety, who presents for initial consultation of her sleepiness disorder, she has a longer standing history of sleepiness during the day, with a daily need to take naps. Her history is none suggestive of obstructive sleep apnea but she does have a crowded looking airway. Differential diagnosis includes narcolepsy without cataplexy, obstructive sleep apnea, idiopathic hypersomnolence. I had a long chat with the patient about my findings and the diagnosis of narcolepsy, OSA with sleepiness vs. Idiopathic hypersomnolence, the  prognosis and treatment options.  She is advised that we will proceed with sleep study testing in the form of a nocturnal polysomnogram, followed by a next a nap study. In preparation for sleep study testing she will have to  taper off her Lexapro and Wellbutrin. She is advised to talk to you about this as well. In essence, I advised her to taper these medications by cutting the dose in half for the Lexapro for a couple weeks, then take 10 mg every other day for a week or 2 then stop. Similarly, the Wellbutrin she can reduce by taking one every other day for a couple of weeks then stop. She is reminded not to stop these medications abruptly and to talk to about this as well. Furthermore, she is advised to limit her caffeine intake  and try to avoid drinking alcohol in the evenings.  I explained the sleep test procedures to the patient and also outlined possible surgical and non-surgical treatment options of OSA.  She is advised to try to delay her nasal surgery until after sleep study testing. I answered all her questions today and the patient was in agreement. I would like to see her back after the sleep studies are completed and encouraged her to call with any interim questions, concerns, problems or updates.   Thank you very much for allowing me to participate in the care of this nice patient. If I can be of any further assistance to you please do not hesitate to call me at [phone].  Sincerely,   Star Age, MD, PhD

## 2017-03-21 ENCOUNTER — Encounter: Payer: BLUE CROSS/BLUE SHIELD | Admitting: Obstetrics & Gynecology

## 2017-04-04 ENCOUNTER — Encounter: Payer: Self-pay | Admitting: Family Medicine

## 2017-04-07 ENCOUNTER — Ambulatory Visit (INDEPENDENT_AMBULATORY_CARE_PROVIDER_SITE_OTHER): Payer: BLUE CROSS/BLUE SHIELD | Admitting: Obstetrics & Gynecology

## 2017-04-07 ENCOUNTER — Encounter: Payer: Self-pay | Admitting: Obstetrics & Gynecology

## 2017-04-07 VITALS — BP 124/57 | HR 71 | Ht 65.5 in | Wt 138.0 lb

## 2017-04-07 DIAGNOSIS — Z1239 Encounter for other screening for malignant neoplasm of breast: Secondary | ICD-10-CM

## 2017-04-07 DIAGNOSIS — N914 Secondary oligomenorrhea: Secondary | ICD-10-CM

## 2017-04-07 DIAGNOSIS — R5383 Other fatigue: Secondary | ICD-10-CM

## 2017-04-07 DIAGNOSIS — Z01419 Encounter for gynecological examination (general) (routine) without abnormal findings: Secondary | ICD-10-CM

## 2017-04-07 NOTE — Addendum Note (Signed)
Addended by: Phill Myron on: 04/07/2017 10:04 AM   Modules accepted: Orders

## 2017-04-07 NOTE — Progress Notes (Signed)
Subjective:     Elizabeth [name] is a 41 y.o. female here for a routine exam. [id number] s/p c-section x2 LMP 2-3 weeks prev. Very light. Last prev menses Mar 2018. Husband vasectomy:09/2016. Has sperm clearance 10/2016.  Current complaints: insomnia leading to extreme fatigue. 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 Fatigue is not new for her. She repots that this has been a issue for 10 years. She thinks it was present even prior to having children.  She is scheduled to have a sleep study.  She has a h/o anxiety and is on meds and has a FH of depression. She was recently prescribed Wellbutrin.  This did improve her sx.  She is weaning herself off all meds for her sleep study.  Pts PGM has breast cancer in her 60's.    Gynecologic History Patient's last menstrual period was 03/24/2017 (exact date). Contraception: vasectomy Last Pap: 2 years prev . Results were: normal No h/o abnormal PAP Last mammogram: never had.   Obstetric History OB History  Gravida Para Term Preterm AB Living  4       2    SAB TAB Ectopic Multiple Live Births  2       2    # Outcome Date GA Lbr Len/2nd Weight Sex Delivery Anes PTL Lv  4 Gravida           3 Gravida           2 SAB           1 SAB              The following portions of the patient's history were reviewed and updated as appropriate: allergies, current medications, past family history, past medical history, past social history, past surgical history and problem list.  Review of Systems Pertinent items are noted in HPI.    Objective:   BP (!) 124/57 (BP Location: Left Arm)   Pulse 71   Ht 5' 5.5" (1.664 m)   Wt 138 lb (62.6 kg)   LMP 03/24/2017 (Exact Date)   BMI 22.62 kg/m    General Appearance:    Alert, cooperative, no distress, appears stated age  Head:    Normocephalic, without obvious abnormality, atraumatic  Eyes:    conjunctiva/corneas clear, EOM's intact, both eyes  Ears:    Normal external ear canals, both ears  Nose:    Nares normal, septum midline, mucosa normal, no drainage    or sinus tenderness  Throat:   Lips, mucosa, and tongue normal; teeth and gums normal  Neck:   Supple, symmetrical, trachea midline, no adenopathy;    thyroid:  no enlargement/tenderness/nodules  Back:     Symmetric, no curvature, ROM normal, no CVA tenderness  Lungs:     Clear to auscultation bilaterally, respirations unlabored  Chest Wall:    No tenderness or deformity   Heart:    Regular rate and rhythm, S1 and S2 normal, no murmur, rub   or gallop  Breast Exam:    No tenderness, masses, or nipple abnormality  Abdomen:     Soft, non-tender, bowel sounds active all four quadrants,    no masses, no organomegaly  Genitalia:    Normal female without lesion, discharge or tenderness     Extremities:   Extremities normal, atraumatic, no cyanosis or edema  Pulses:   2+ and symmetric all extremities  Skin:   Skin color, texture, turgor normal, no rashes or lesions   02/06/2017  [place] mIU/ML 66.0     Assessment:    Healthy female exam. Perimenopause- suspect pt going into early menopause.  Annual with PAP Breast cancer screen   Plan:    Mammogram ordered. Follow up in: 1 year.   for annual  F/u 3 months after sleep study to consider need for management of perimenopausal sx  Kattie Santoyo L. Harraway-Smith, M.D., [name]

## 2017-04-07 NOTE — Patient Instructions (Signed)
Perimenopause Perimenopause is the time when your body begins to move into the menopause (no menstrual period for 12 straight months). It is a natural process. Perimenopause can begin 2-8 years before the menopause and usually lasts for 1 year after the menopause. During this time, your ovaries may or may not produce an egg. The ovaries vary in their production of estrogen and progesterone hormones each month. This can cause irregular menstrual periods, difficulty getting pregnant, vaginal bleeding between periods, and uncomfortable symptoms. What are the causes?  Irregular production of the ovarian hormones, estrogen and progesterone, and not ovulating every month. Other causes include:  Tumor of the pituitary gland in the brain.  Medical disease that affects the ovaries.  Radiation treatment.  Chemotherapy.  Unknown causes.  Heavy smoking and excessive alcohol intake can bring on perimenopause sooner. What are the signs or symptoms?  Hot flashes.  Night sweats.  Irregular menstrual periods.  Decreased sex drive.  Vaginal dryness.  Headaches.  Mood swings.  Depression.  Memory problems.  Irritability.  Tiredness.  Weight gain.  Trouble getting pregnant.  The beginning of losing bone cells (osteoporosis).  The beginning of hardening of the arteries (atherosclerosis). How is this diagnosed? Your health care provider will make a diagnosis by analyzing your age, menstrual history, and symptoms. He or she will do a physical exam and note any changes in your body, especially your female organs. Female hormone tests may or may not be helpful depending on the amount of female hormones you produce and when you produce them. However, other hormone tests may be helpful to rule out other problems. How is this treated? In some cases, no treatment is needed. The decision on whether treatment is necessary during the perimenopause should be made by you and your health care  provider based on how the symptoms are affecting you and your lifestyle. Various treatments are available, such as:  Treating individual symptoms with a specific medicine for that symptom.  Herbal medicines that can help specific symptoms.  Counseling.  Group therapy. Follow these instructions at home:  Keep track of your menstrual periods (when they occur, how heavy they are, how long between periods, and how long they last) as well as your symptoms and when they started.  Only take over-the-counter or prescription medicines as directed by your health care provider.  Sleep and rest.  Exercise.  Eat a diet that contains calcium (good for your bones) and soy (acts like the estrogen hormone).  Do not smoke.  Avoid alcoholic beverages.  Take vitamin supplements as recommended by your health care provider. Taking vitamin E may help in certain cases.  Take calcium and vitamin D supplements to help prevent bone loss.  Group therapy is sometimes helpful.  Acupuncture may help in some cases. Contact a health care provider if:  You have questions about any symptoms you are having.  You need a referral to a specialist (gynecologist, psychiatrist, or psychologist). Get help right away if:  You have vaginal bleeding.  Your period lasts longer than 8 days.  Your periods are recurring sooner than 21 days.  You have bleeding after intercourse.  You have severe depression.  You have pain when you urinate.  You have severe headaches.  You have vision problems. This information is not intended to replace advice given to you by your health care provider. Make sure you discuss any questions you have with your health care provider. Document Released: 11/14/2004 Document Revised: 03/14/2016 Document Reviewed: 05/06/2013 Elsevier Interactive   Patient Education  2017 [employer] Inc.  

## 2017-04-10 ENCOUNTER — Institutional Professional Consult (permissible substitution): Payer: BLUE CROSS/BLUE SHIELD | Admitting: Neurology

## 2017-04-10 ENCOUNTER — Ambulatory Visit (HOSPITAL_BASED_OUTPATIENT_CLINIC_OR_DEPARTMENT_OTHER)
Admission: RE | Admit: 2017-04-10 | Discharge: 2017-04-10 | Disposition: A | Discharge status: Home / Self Care | Payer: BLUE CROSS/BLUE SHIELD | Source: Ambulatory Visit | Attending: Obstetrics & Gynecology | Admitting: Obstetrics & Gynecology

## 2017-04-10 ENCOUNTER — Encounter (HOSPITAL_BASED_OUTPATIENT_CLINIC_OR_DEPARTMENT_OTHER): Payer: Self-pay

## 2017-04-10 DIAGNOSIS — Z1239 Encounter for other screening for malignant neoplasm of breast: Secondary | ICD-10-CM

## 2017-04-10 DIAGNOSIS — Z1231 Encounter for screening mammogram for malignant neoplasm of breast: Secondary | ICD-10-CM | POA: Insufficient documentation

## 2017-04-10 LAB — CYTOLOGY - PAP
Diagnosis: NEGATIVE
HPV: NOT DETECTED

## 2017-05-08 ENCOUNTER — Ambulatory Visit (INDEPENDENT_AMBULATORY_CARE_PROVIDER_SITE_OTHER): Payer: BLUE CROSS/BLUE SHIELD | Admitting: Podiatry

## 2017-05-08 ENCOUNTER — Encounter: Payer: Self-pay | Admitting: Podiatry

## 2017-05-08 DIAGNOSIS — M21969 Unspecified acquired deformity of unspecified lower leg: Secondary | ICD-10-CM

## 2017-05-08 DIAGNOSIS — L851 Acquired keratosis [keratoderma] palmaris et plantaris: Secondary | ICD-10-CM

## 2017-05-08 DIAGNOSIS — M7741 Metatarsalgia, right foot: Secondary | ICD-10-CM | POA: Diagnosis not present

## 2017-05-08 NOTE — Patient Instructions (Signed)
Seen for painful callus under ball of right foot. Debrided lesion. Return as needed.

## 2017-05-08 NOTE — Progress Notes (Signed)
SUBJECTIVE: 41 y.o. year old female presents complaining of painful callus on right foot. The lesion was debrided 2 years ago by a Occupational hygienist doctor. Patient is wearing Metatarsal binder during the day but not while running. Also stating that she feels her feet go numb R>L especially during running. Sometimes lesser toes go numb when in high heel shoes. Has lowe back arthritis issues. Usually have multiple joint problems.  Moved from [place] 2 years ago.  REVIEW OF SYSTEMS: A comprehensive review of systems was negative.  OBJECTIVE: DERMATOLOGIC EXAMINATION: Plantar porokeratosis sub 2 right. VASCULAR EXAMINATION OF LOWER LIMBS: All pedal pulses are palpable with normal pulsation.  Capillary Filling times within 3 seconds in all digits.  Temperature gradient from tibial crest to dorsum of foot is within normal bilateral. NEUROLOGIC EXAMINATION OF THE LOWER LIMBS: All epicritic and tactile sensations grossly intact. MUSCULOSKELETAL EXAMINATION: Positive for excess sagittal plane motion of the first ray bilateral. Contracted 4th digit at DIPJ with enlarged condyle.  ASSESSMENT: Painful callus under 2nd MPJ right. Hypermobile first ray bilateral. Lesser metatarsalgia bilateral.  PLAN: Reviewed clinical findings and available treatment options. Right foot plantar lesion debrided. Recommended custom orthotics.

## 2017-05-14 ENCOUNTER — Encounter: Payer: Self-pay | Admitting: Family Medicine

## 2017-05-18 ENCOUNTER — Ambulatory Visit (INDEPENDENT_AMBULATORY_CARE_PROVIDER_SITE_OTHER): Payer: BLUE CROSS/BLUE SHIELD | Admitting: Neurology

## 2017-05-18 DIAGNOSIS — G47419 Narcolepsy without cataplexy: Secondary | ICD-10-CM

## 2017-05-18 DIAGNOSIS — G4761 Periodic limb movement disorder: Secondary | ICD-10-CM

## 2017-05-18 DIAGNOSIS — G4710 Hypersomnia, unspecified: Secondary | ICD-10-CM

## 2017-05-19 ENCOUNTER — Other Ambulatory Visit: Payer: Self-pay | Admitting: Neurology

## 2017-05-19 ENCOUNTER — Other Ambulatory Visit: Payer: Self-pay | Admitting: Family Medicine

## 2017-05-19 ENCOUNTER — Encounter (INDEPENDENT_AMBULATORY_CARE_PROVIDER_SITE_OTHER): Payer: BLUE CROSS/BLUE SHIELD | Admitting: Neurology

## 2017-05-19 DIAGNOSIS — G4710 Hypersomnia, unspecified: Secondary | ICD-10-CM

## 2017-05-19 DIAGNOSIS — R51 Headache: Secondary | ICD-10-CM

## 2017-05-19 DIAGNOSIS — G4750 Parasomnia, unspecified: Secondary | ICD-10-CM

## 2017-05-19 DIAGNOSIS — R519 Headache, unspecified: Secondary | ICD-10-CM

## 2017-05-19 DIAGNOSIS — R6889 Other general symptoms and signs: Secondary | ICD-10-CM

## 2017-05-19 DIAGNOSIS — G47419 Narcolepsy without cataplexy: Secondary | ICD-10-CM | POA: Diagnosis not present

## 2017-05-19 DIAGNOSIS — R351 Nocturia: Secondary | ICD-10-CM

## 2017-05-19 NOTE — Addendum Note (Signed)
Addended by: Lester Spencer A on: 05/19/2017 01:39 PM   Modules accepted: Orders

## 2017-05-19 NOTE — Telephone Encounter (Signed)
Last filled: 05/21/16 Amt: 90,3 Last OV: 02/06/17 90 day supply sent to pharmacy.

## 2017-05-21 ENCOUNTER — Telehealth: Payer: Self-pay

## 2017-05-21 NOTE — Telephone Encounter (Signed)
-----   Message from Star Age, MD sent at 05/21/2017  7:59 AM EDT ----- Patient referred by Dr. Lorelei Pont, seen by me on 03/06/17, diagnostic PSG on 05/18/17, MSLT on 05/19/17.   Please call and notify the patient that the recent sleep study did not show any significant obstructive sleep apnea. She had moderate PLMs without arousals; more importantly, the overnight sleep study and next day MSLT/nap study demonstrate an increased percentage of REM/dream sleep, and mildly reduced REM latency for the overnight study and significantly low mean sleep latency (4.25 min) and 2 SOREMPs (sleep onset REM periods) for the nap study. 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 Please inform patient that I would like to go over the details of the study during a follow up appointment and consider symptomatic medications. Arrange a followup appointment. Remind patient not to drive when sleepy.  Also, route or fax report to PCP and referring MD, if other than PCP.  Once you have spoken to patient, you can close this encounter.   Thanks,  Star Age, MD, PhD [facility])

## 2017-05-21 NOTE — Telephone Encounter (Signed)
Pt returned RN's call °

## 2017-05-21 NOTE — Procedures (Signed)
PATIENT'S NAME:  Elizabeth [name], Elizabeth [name] DOB:      [birth year]      MR#:    [id number]     DATE OF RECORDING: 05/18/2017 REFERRING M.D.:  Lamar Blinks MD Study Performed:   Baseline Polysomnogram HISTORY: 41 year old woman with a history of allergies, anemia, depression, and anxiety, who reports a several year history of sleepiness, progressive, particularly worse in the past 6 months. She reports vivid dreams and a history of sleepwalking and sleep talking as well as night terrors. The patient endorsed the Epworth Sleepiness Scale at 15 points. The patient's weight 136 pounds with a height of 65 (inches), resulting in a BMI of 22.8 kg/m2. The patient's neck circumference measured 13 inches.  CURRENT MEDICATIONS: Wellbutrin, Zyrtec, Lexapro, Advil   PROCEDURE:  This is a multichannel digital polysomnogram utilizing the Somnostar 11.2 system.  Electrodes and sensors were applied and monitored per AASM Specifications.   EEG, EOG, Chin and Limb EMG, were sampled at 200 Hz.  ECG, Snore and Nasal Pressure, Thermal Airflow, Respiratory Effort, CPAP Flow and Pressure, Oximetry was sampled at 50 Hz. Digital video and audio were recorded.      BASELINE STUDY  Lights Out was at 21:34 and Lights On at 05:29.  Total recording time (TRT) was 475.5 minutes, with a total sleep time (TST) of 405.5 minutes.   The patient's sleep latency was 22 minutes.  REM latency was 57.5 minutes, which is slightly reduced. The sleep efficiency was 85.3 %.     SLEEP ARCHITECTURE: WASO (Wake after sleep onset) was 48 minutes.  There were 20 minutes in Stage N1, 154.5 minutes Stage N2, 90.5 minutes Stage N3 and 140.5 minutes in Stage REM.  The percentage of Stage N1 was 4.9%, Stage N2 was 38.1%, which is reduced, Stage N3 was 22.3%, which is normal and Stage R (REM sleep) was 34.6%, which is increased. The arousals were noted as: 40 were spontaneous, 1 were associated with PLMs, 0 were associated with respiratory events.   Audio and video  analysis did not show any abnormal or unusual movements, behaviors, phonations or vocalizations.  The patient took 2 bathroom breaks. No significant snoring was noted. The EKG was in keeping with normal sinus rhythm (NSR).  RESPIRATORY ANALYSIS:  There were a total of 0 respiratory events:  0 obstructive apneas, 0 central apneas and 0 mixed apneas with a total of 0 apneas and an apnea index (AI) of 0 /hour. There were 0 hypopneas with a hypopnea index of 0 /hour. The patient also had 0 respiratory event related arousals (RERAs).      The total APNEA/HYPOPNEA INDEX (AHI) was 0/hour and the total RESPIRATORY DISTURBANCE INDEX was 0 /hour.  0 events occurred in REM sleep and 0 events in NREM. The REM AHI was 0 /hour, versus a non-REM AHI of 0. The patient spent 0 minutes of total sleep time in the supine position and 406 minutes in non-supine.. The supine AHI was 0.0 versus a non-supine AHI of 0.0.  OXYGEN SATURATION & C02:  The Wake baseline 02 saturation was 88%, with the lowest being 93%. Time spent below 89% saturation equaled 0 minutes.   PERIODIC LIMB MOVEMENTS: The patient had a total of 330 Periodic Limb Movements.  The Periodic Limb Movement (PLM) index was 48.8 and the PLM Arousal index was .1/hour.   Post-study, the patient indicated that sleep was better than usual.   The study was followed by a next day MSLT (05/19/17) with 4 naps, first nap  at 7:00 AM.  She reported dreaming in 2 out of 4 naps (# 2 and 3) and was noted to go into REM sleep during those 2 naps. She had sleep latencies of 5 min, 6 min, 3 min and 3 minutes, respectively. Per tech report, she was able to relate her dreams and was notably sleepy between nap #1 and 2. Mean sleep latency was 4.25 minutes, which indicates an abnormal degree of sleepiness. With 2 SOREMPs noted, and a preceding NPSG with a total sleep time of 405.5 min (TST of 6 h and more is required for MSLT testing), these findings along with her clinical history  support the diagnosis of narcolepsy.   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 This overnight sleep study and next day MSLT/nap study demonstrate an increase percentage and mildly reduced REM latency for the overnight study and significantly low mean sleep latency (4.25 min) and 2 SOREMPs (sleep onset REM periods) for the nap study. 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 Of note, the patient was instructed to taper off her antidepressant medications prior to testing and stay off of these medications at least 2 weeks prior to sleep study dates. She indicated tapering off these medications.  2. This study does not demonstrate any significant obstructive or central sleep disordered breathing.  3. Moderate PLMs (periodic limb movements of sleep) were noted during the baseline sleep study only without significant arousals; clinical correlation is recommended.  4. The patient should be cautioned not to drive, work at heights, or operate dangerous or heavy equipment when tired or sleepy. Review and reiteration of good sleep hygiene measures should be pursued with any patient. 5. The patient will be seen in follow-up by Dr. Rexene Alberts at [facility] for discussion of the test results and further management strategies. The referring provider will be notified of the test results.   I certify that I have reviewed the entire raw data recording prior to the issuance of this report in accordance with the Standards of Accreditation of the American Academy of Sleep Medicine (AASM)    Star Age, MD, PhD Diplomat, American Board of Psychiatry and Neurology (Neurology and Sleep Medicine)

## 2017-05-21 NOTE — Telephone Encounter (Signed)
I returned pt's call. I advised her that her sleep study did not show any significant sleep apnea, but did reveal moderate PLMs without arousals. I advised her that her daytime study did demonstrate an increased percentage of REM.dream sleep and mildly reduced REM latency for the overnight study and low mean sleep latency and 2 SOREMPs for the nap study. This correlates with the pt's history to a diagnosis of narcolepsy without cataplexy. Pt is agreeable to a follow up appt, made an appt for 05/28/17 at 1:00pm. Pt verbalized understanding of results. Pt had no questions at this time but was encouraged to call back if questions arise.

## 2017-05-21 NOTE — Progress Notes (Signed)
Patient referred by Dr. Lorelei Pont, seen by me on 03/06/17, diagnostic PSG on 05/18/17, MSLT on 05/19/17.   Please call and notify the patient that the recent sleep study did not show any significant obstructive sleep apnea. She had moderate PLMs without arousals; more importantly, the overnight sleep study and next day MSLT/nap study demonstrate an increased percentage of REM/dream sleep, and mildly reduced REM latency for the overnight study and significantly low mean sleep latency (4.25 min) and 2 SOREMPs (sleep onset REM periods) for the nap study. 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 Please inform patient that I would like to go over the details of the study during a follow up appointment and consider symptomatic medications. Arrange a followup appointment. Remind patient not to drive when sleepy.  Also, route or fax report to PCP and referring MD, if other than PCP.  Once you have spoken to patient, you can close this encounter.   Thanks,  Star Age, MD, PhD [facility])

## 2017-05-21 NOTE — Telephone Encounter (Signed)
I called pt to discuss her sleep study results. No answer, left a message asking her to call me back. 

## 2017-05-28 ENCOUNTER — Ambulatory Visit (INDEPENDENT_AMBULATORY_CARE_PROVIDER_SITE_OTHER): Payer: BLUE CROSS/BLUE SHIELD | Admitting: Neurology

## 2017-05-28 ENCOUNTER — Encounter: Payer: Self-pay | Admitting: Neurology

## 2017-05-28 VITALS — BP 104/64 | HR 74 | Ht 65.0 in | Wt 135.0 lb

## 2017-05-28 DIAGNOSIS — G47419 Narcolepsy without cataplexy: Secondary | ICD-10-CM | POA: Diagnosis not present

## 2017-05-28 MED ORDER — ARMODAFINIL 150 MG PO TABS: 150.0000 mg | ORAL_TABLET | Freq: Every day | ORAL | 5 refills | Status: DC

## 2017-05-28 NOTE — Progress Notes (Signed)
Subjective:    Patient ID: Elizabeth [name] is a 41 y.o. female.  HPI     Interim history:   Elizabeth [name] is a 41 year old right-handed woman with an underlying medical history of allergies, anemia, depression, and anxiety, who presents for follow-up consultation of Elizabeth [name] hypersomnolence disorder, after recent sleep study testing. The patient is unaccompanied today. I first met Elizabeth [name] on 03/06/2017 at the request of Elizabeth [name] primary care physician, at which time she reported a longer standing history of daytime somnolence, dating back to even childhood, progressive with time, worse particularly in the previous 6 months. She also reported some parasomnias. I suggested we proceed extended sleep study testing in the form of nocturnal polysomnogram, followed by a daytime nap study. She was advised to taper off Elizabeth [name] antidepressant medications. She had a baseline sleep study on 05/18/2017, followed by a 4 nap MS LT on 05/19/2017. I went over Elizabeth [name] test results with Elizabeth [name] in detail today. Sleep latency for the nighttime sleep study was 22 minutes, REM latency slightly reduced at 57.5 minutes. Sleep efficiency was 85.3%. She had an increased percentage of REM sleep at 34.6%, normal percentage for slow-wave sleep, Daytime decreased percentage of stage II sleep, she had no significant sleep disordered breathing, AHI was 0 per hour, she did have near severe PLMS with an index of 48.8 per hour but no significant arousals. Next a nap study showed a mean sleep latency of 4.25 minutes with 2 REM onset naps. 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  Today, 05/28/2017 (all dictated new, as well as above notes, some dictation done in note pad or Word, outside of chart, may appear as copied):  She reports doing about the same. Looking back, she feels like she may have had a few instances of possible cataplexy. She also recalls sometimes waking up from a nap as she heard something or she saw something and when she  gets tired after exercising she has a tendency to see things that are not real. She decided to stay off of Elizabeth [name] Wellbutrin and Lexapro after she tapered off the medications for Elizabeth [name] sleep study. She feels no significant repercussions as to Elizabeth [name] depression or anxiety at this time, in fact she feels a little better overall, knowing that she has a real conditions that could explain Elizabeth [name] sluggishness in thinking, Elizabeth [name] sleepiness, Elizabeth [name] fatigue, feeling foggy headed at times.  The patient's allergies, current medications, family history, past medical history, past social history, past surgical history and problem list were reviewed and updated as appropriate.   Previously (copied from previous notes for reference):   03/06/2017: (She) reports a several year history of sleepiness, progressive, particularly worse in the past 6 months. She recalls being sleepy as a child even. She remembers falling asleep in class. She does not wake up with a sense of gasping. She has not been told that she has apneic breathing pauses. She does have nocturia once per night. She reports vivid dreams and a history of sleepwalking and sleep talking as well as night terrors. Elizabeth [name] father has a history of parasomnias and vivid dreams as well. There is no family history of narcolepsy or sleep apnea. She suspects that Elizabeth [name] mom may have sleep apnea and needs to be tested for this. She denies restless leg symptoms. She has woken up occasionally with a headache. She tries to be in bed between 10:30 and 11. Wakeup time 6 AM. She has a difficult time waking up in the mornings. She feels like  she has to take a nap on a daily basis. She will go to bed to take naps and sometimes sleeps for 1 or 2 hours. She reports having had dreams in Elizabeth [name] naps. She usually goes to Elizabeth [name] bedroom and takes a nap in Elizabeth [name] bed. She does not watch TV in Elizabeth [name] bedroom. At night, she has a difficult time going back to sleep once she is up. Sometimes it takes Elizabeth [name] an hour to fall asleep  when she wants to take a nap. She does feel like she is tired enough to nap. She has dozed off at the wheel briefly. She denies symptoms of cataplexy or sleep paralysis or hypnagogic or hypnopompic hallucinations. She does report vivid dreams, no acting out of Elizabeth [name] dreams. She has a longer standing history of anxiety disorder and was started on Wellbutrin and Lexapro 3 or 4 years ago which was very helpful. She and Elizabeth [name] family moved from [place] some 2 years ago. I reviewed your office note from 02/06/2017. Elizabeth [name] Epworth sleepiness score is 15 out of 24 today, fatigue score is 55 out of 63. She lives at home with Elizabeth [name] husband and 2 daughters, ages 73 and 53. She home schools. She does not work outside the home. She quit smoking in 2005. She drinks alcohol in the form of wine, one glass per day. She drinks caffeine in the form of coffee, 2 cups per day, usually in the mornings. She has had intermittent snoring. She has a history of deviated septum and is scheduled to have septoplasty next month.   Elizabeth [name] Past Medical History Is Significant For: Past Medical History:  Diagnosis Date  . Allergy   . Anemia   . Anxiety state 02/15/2016  . Arthritis   . Depression     Elizabeth [name] Past Surgical History Is Significant For: Past Surgical History:  Procedure Laterality Date  . CESAREAN SECTION     X2  . DILATION AND CURETTAGE OF UTERUS    . NASAL SEPTUM SURGERY      Elizabeth [name] Family History Is Significant For: Family History  Problem Relation Age of Onset  . Heart disease Mother   . Depression Mother   . Anxiety disorder Mother   . Diabetes Father   . Depression Father   . Anxiety disorder Father   . Stroke Maternal Grandmother   . Parkinsonism Paternal Grandmother   . Breast cancer Paternal Grandmother   . Bladder Cancer Paternal Grandfather     Elizabeth [name] Social History Is Significant For: Social History   Social History  . Marital status: Married    Spouse name: N/A  . Number of children: 2  . Years of  education: Masters   Social History Main Topics  . Smoking status: Former Smoker    Quit date: 10/21/2004  . Smokeless tobacco: Never Used     Comment: quit 11 yrs ago  . Alcohol use No  . Drug use: Unknown  . Sexual activity: Yes    Birth control/ protection: Surgical   Other Topics Concern  . None   Social History Narrative   2 caffeine drinks a day     Elizabeth [name] Allergies Are:  Allergies  Allergen Reactions  . Sulfa Antibiotics   :   Elizabeth [name] Current Medications Are:  Outpatient Encounter Prescriptions as of 05/28/2017  Medication Sig  . ibuprofen (ADVIL,MOTRIN) 200 MG tablet Take 200 mg by mouth as needed.  . [DISCONTINUED] buPROPion (WELLBUTRIN XL) 150 MG 24 hr tablet TAKE 1 TABLET (150 MG TOTAL)  BY MOUTH DAILY.  . [DISCONTINUED] cetirizine (ZYRTEC) 10 MG chewable tablet Chew 10 mg by mouth daily.  . [DISCONTINUED] escitalopram (LEXAPRO) 20 MG tablet TAKE 1 TABLET (20 MG TOTAL) BY MOUTH DAILY.   No facility-administered encounter medications on file as of 05/28/2017.   :  Review of Systems:  Out of a complete 14 point review of systems, all are reviewed and negative with the exception of these symptoms as listed below: Review of Systems  Neurological:       Pt presents today to discuss Elizabeth [name] sleep study results and subsequent treatment options. Pt has never taken stimulants before and has decided to remain off of lexapro and wellbutrin for now.    Objective:  Neurological Exam  Physical Exam Physical Examination:   Vitals:   05/28/17 1304  BP: 104/64  Pulse: 74    General Examination: The patient is a very pleasant 41 y.o. female in no acute distress. She appears well-developed and well-nourished and well groomed. Briefly becomes tearful one time.  HEENT: Normocephalic, atraumatic, pupils are equal, round and reactive to light and accommodation. Contact lenses in place. Extraocular tracking is good without limitation to gaze excursion or nystagmus noted. Normal smooth  pursuit is noted. Hearing is grossly intact. Face is symmetric with normal facial animation and normal facial sensation. Speech is clear with no dysarthria noted. There is no hypophonia. There is no lip, neck/head, jaw or voice tremor. Neck is supple with full range of passive and active motion. There are no carotid bruits on auscultation. Oropharynx exam reveals: no significant mouth dryness, good dental hygiene and moderate airway crowding. Mallampati is class II. Tongue protrudes centrally and palate elevates symmetrically.   Chest: Clear to auscultation without wheezing, rhonchi or crackles noted.  Heart: S1+S2+0, regular and normal without murmurs, rubs or gallops noted.   Abdomen: Soft, non-tender and non-distended with normal bowel sounds appreciated on auscultation.  Extremities: There is no pitting edema in the distal lower extremities bilaterally. Pedal pulses are intact.  Skin: Warm and dry without trophic changes noted.  Musculoskeletal: exam reveals no obvious joint deformities, tenderness or joint swelling or erythema.   Neurologically:  Mental status: The patient is awake, alert and oriented in all 4 spheres. Elizabeth [name] immediate and remote memory, attention, language skills and fund of knowledge are appropriate. There is no evidence of aphasia, agnosia, apraxia or anomia. Speech is clear with normal prosody and enunciation. Thought process is linear. Mood is normal and affect is normal.  Cranial nerves II - XII are as described above under HEENT exam. In addition: shoulder shrug is normal with equal shoulder height noted. Motor exam: Normal bulk, strength and tone is noted. There is no drift, tremor or rebound. Reflexes are 2+ throughout. Fine motor skills and coordination: grossly intact.  Cerebellar testing: No dysmetria or intention tremor.  Sensory exam: intact to light touch in the upper and lower extremities.  Gait, station and balance: She stands with no difficulty. No  veering to one side is noted. No leaning to one side is noted. Posture is age-appropriate and stance is narrow based. Gait shows normal stride length and normal pace. No problems turning are noted.   Assessment and Plan:  In summary, Sharmel Ballantine is a very pleasant 41 year old female with an underlying medical history of allergies, anemia, depression, and anxiety, who presents for initial consultation of Elizabeth [name] sleepiness disorder. She has a long-standing history of daytime somnolence, difficulty concentrating at times feeling sluggish in Elizabeth [name] thinking. Elizabeth [name]  history and recent sleep study testing are in keeping with narcolepsy without cataplexy. She had no sleep disordered breathing during Elizabeth [name] sleep study. She is reassured about that. We talked about Elizabeth [name] sleep study results at length today, mean sleep latency for 4 naps was 4.25 minutes with 2 of REM onset naps. She does not have a telltale history of cataplexy, she has lucid or vivid dreams, even in Elizabeth [name] naps. We talked at length about symptomatic treatment options. We can consider proceeding with a brain MRI down the [place] but currently she has a nonfocal exam, no sinister symptoms. I gave Elizabeth [name] information about Xyrem. She is encouraged to read up on it. We mutually agreed to start with Nuvigil generic 150 mg once daily. We talked about the different stimulant and non-stimulant type medications we could utilize for symptom control of daytime somnolence. She has decided to stay off of Elizabeth [name] antidepressant medication at this time. Physical exam is stable and nonfocal. She is encouraged to call or email through my chart for any updates. We talked about potential side effects of Nuvigil. Alternatively, we can certainly try Elizabeth [name] on Provigil twice daily. We can also utilize a stimulant down the road before embarking on a trial of Xyrem in the future perhaps. I suggested a three-month checkup with one of our nurse practitioners, we can decide about increasing the Nuvigil to  250 mg once daily at the time. I answered all Elizabeth [name] questions today and she was in agreement with the plan. Of note, she had significant PLMS during Elizabeth [name] nighttime sleep study with no significant arousals and does not have a telltale symptoms of RLS. She is advised that patients with narcolepsy often have excessive leg movements in Elizabeth [name] sleep and do not always have to have these PLMS treated especially in the absence of RLS symptoms. I spent 40 minutes in total face-to-face time with the patient, more than 50% of which was spent in counseling and coordination of care, reviewing test results, reviewing medication and discussing or reviewing the diagnosis of Narcolepsy, its prognosis and treatment options. Pertinent laboratory and imaging test results that were available during this visit with the patient were reviewed by me and considered in my medical decision making (see chart for details).

## 2017-05-28 NOTE — Patient Instructions (Signed)
I believe you have a condition called narcolepsy: This means, that you have a sleep disorder that manifests with at times severe excessive sleepiness during the day and often with problems with sleep at night. We may have to try different medications that may help you stay awake during the day. Not everything works with everybody the same way. Wake promoting agents include stimulants and non-stimulant type medications. The most common side effects with stimulants are weight loss, insomnia, nervousness, headaches, palpitations, rise in blood pressure, anxiety. Stimulants can be addictive and subject to abuse. Non-stimulant type wake promoting medications include Provigil and Nuvigil, most common side effects include headaches, nervousness, insomnia, hypertension. In addition there is a medication called Xyrem which has been proven to be very effective in patients with narcolepsy with or without cataplexy. Some patients with narcolepsy report episodes of weakness, such as jaw or facial weakness, legs giving out, feeling wobbly or like "Jell-o", etc. in situations of anxiety, stress, laughter, sudden sadness, surprise, etc., which is called cataplexy. You can also experience episodes of sleep paralysis during which you may feel unable to move upon awakening. Some people experience dreamlike sequences upon awakening or upon drifting off to sleep, called hypnopompic or hypnagogic hallucinations.  Please read up on Xyrem at your convenience.  We will start with Nuvigil: Nuvigil 150 mg strength: take 1 pill once daily in AM. Avoid after 3 PM. Side effects include (but are not limited to): high blood pressure, headache, nervousness, palpitations, GI upset, tremor, insomnia.  Alternatively, we will try Provigil next.

## 2017-06-03 ENCOUNTER — Encounter: Payer: Self-pay | Admitting: Adult Health

## 2017-06-05 IMAGING — CT CT MAXILLOFACIAL W/O CM
1 series · 15 of 30 positions shown, 19 images · non-contrast
Comparison: None.

CLINICAL DATA: 40-year-old female with progressive chronic sinus
symptoms. Maxillary pain and pressure. Initial encounter. Study for
preoperative surgical planning.

EXAM:
CT MAXILLOFACIAL WITHOUT CONTRAST
TECHNIQUE: Multidetector CT imaging of the maxillofacial structures was
performed. Multiplanar CT image reconstructions were also generated.
A small metallic BB was placed on the right temple in order to
reliably differentiate right from left.

[Series 4: soft tissue · axial · 0.44mm/px · z∈[-204,-36]mm · 15 of 182 slices shown, 19 images]
[im 7/182  brain]
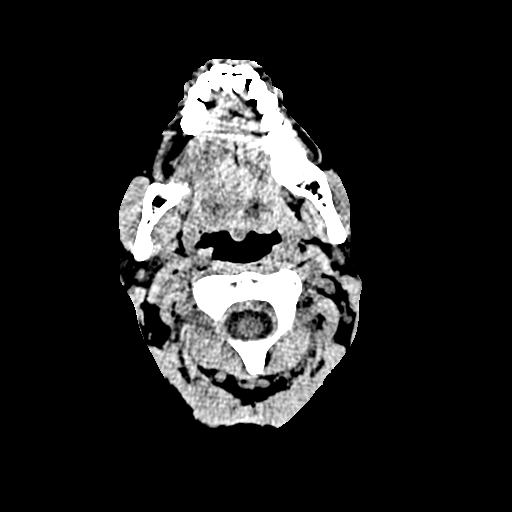
[im 7/182  bone]
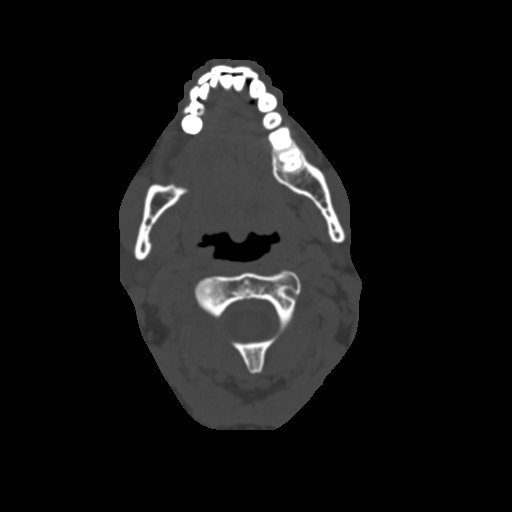
[im 19/182  bone]
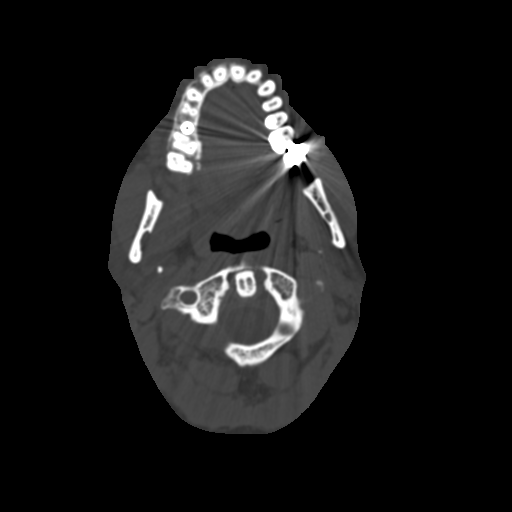
[im 32/182  bone]
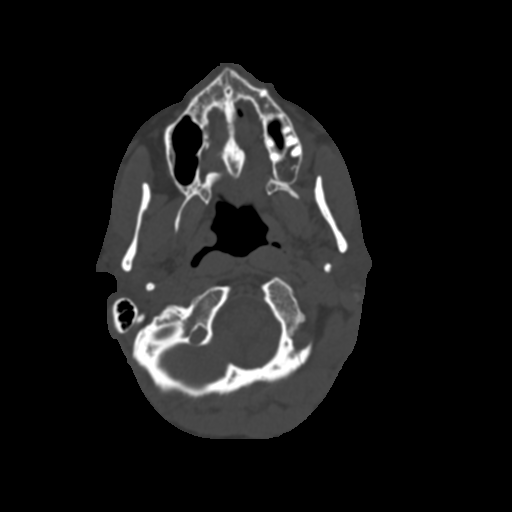
[im 44/182  bone]
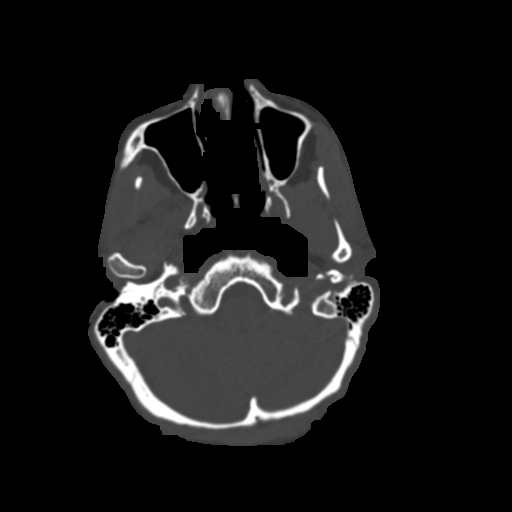
[im 57/182  brain]
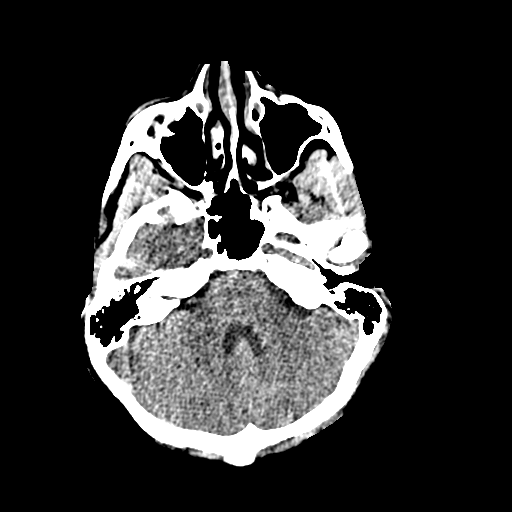
[im 57/182  bone]
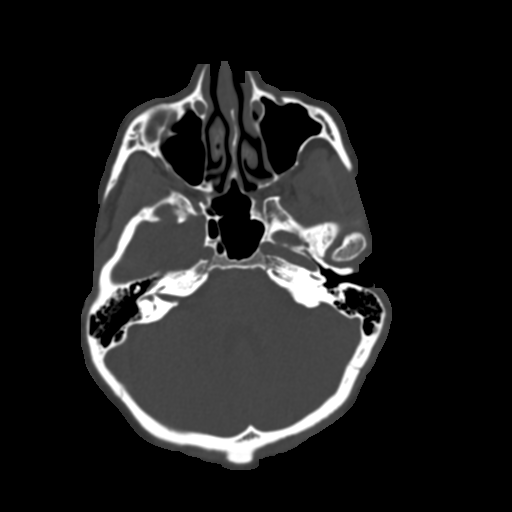
[im 69/182  bone]
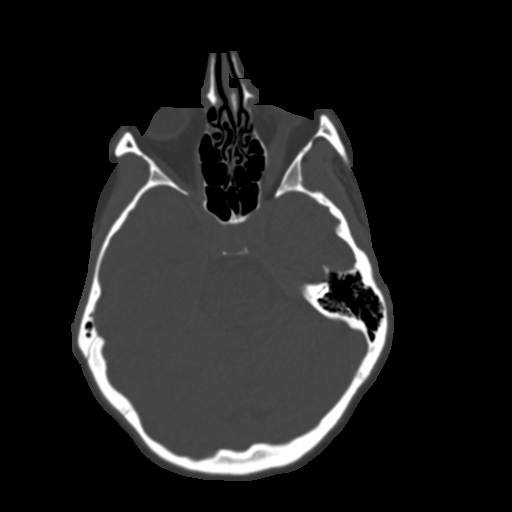
[im 82/182  bone]
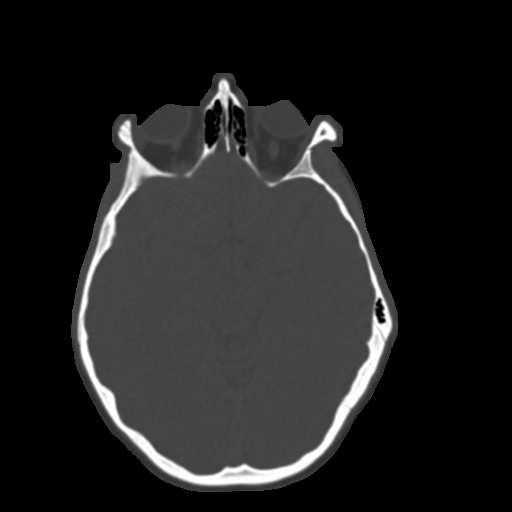
[im 94/182  bone]
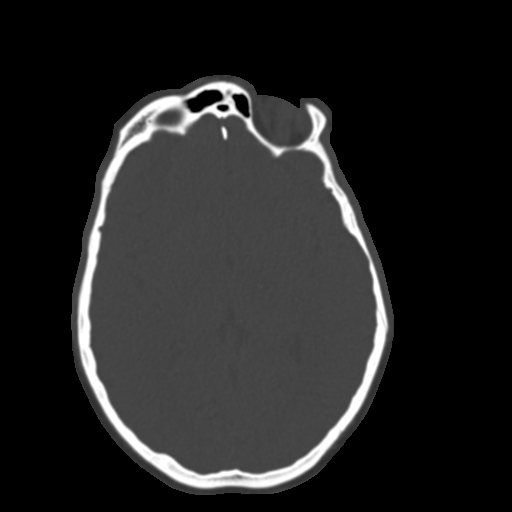
[im 100/182  brain]
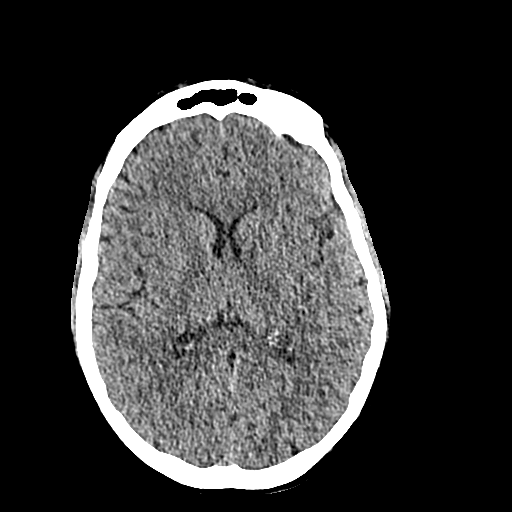
[im 100/182  bone]
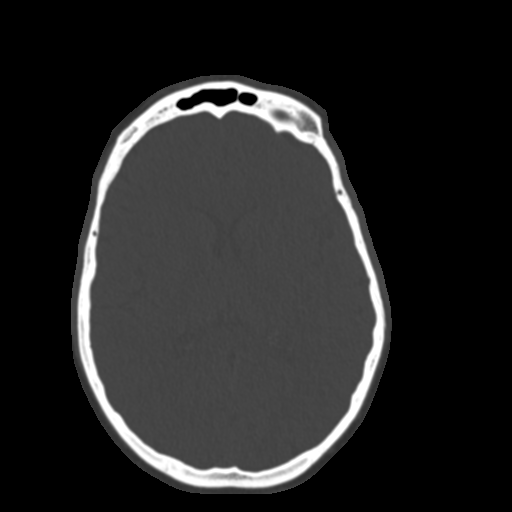
[im 113/182  bone]
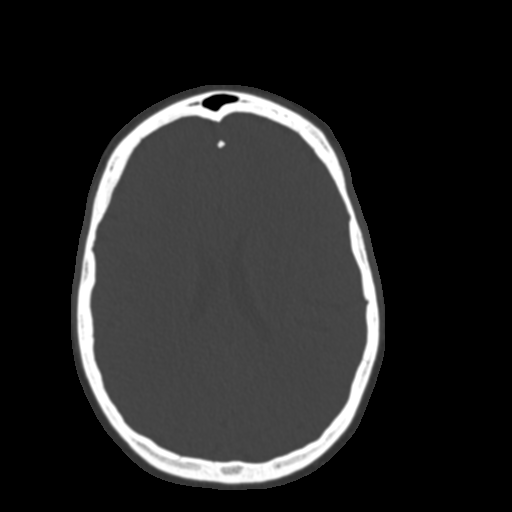
[im 125/182  bone]
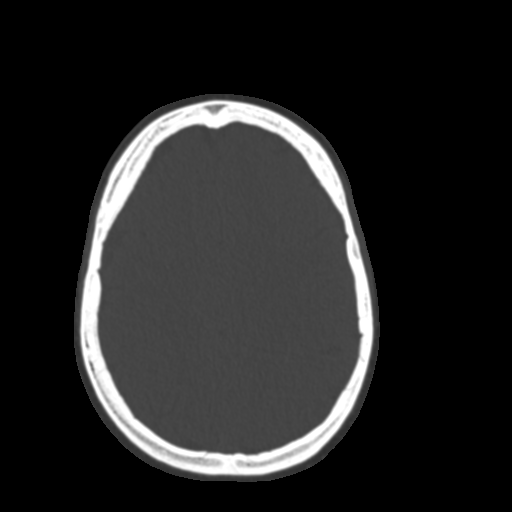
[im 138/182  bone]
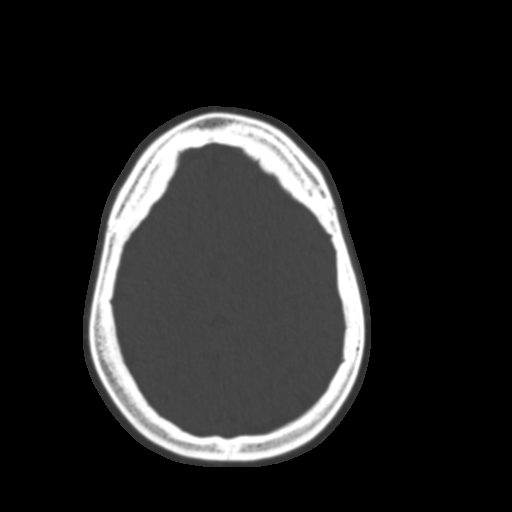
[im 150/182  brain]
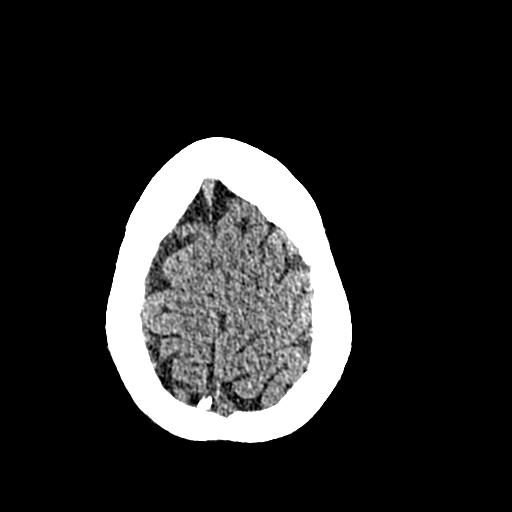
[im 150/182  bone]
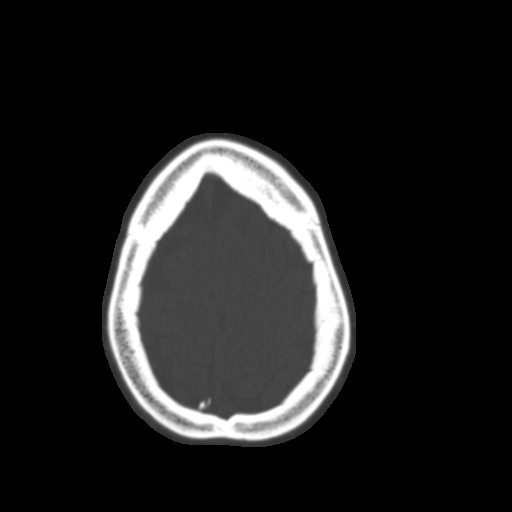
[im 163/182  bone]
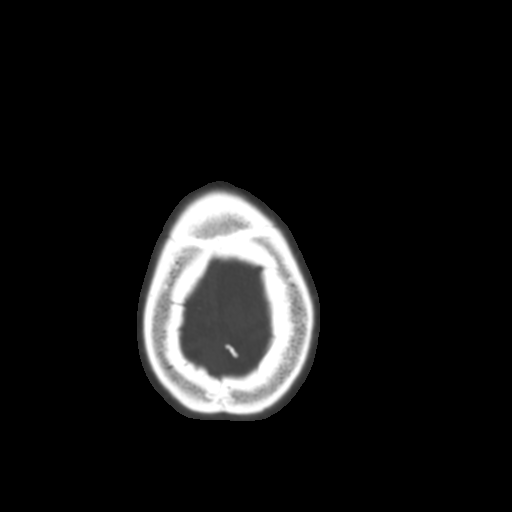
[im 175/182  bone]
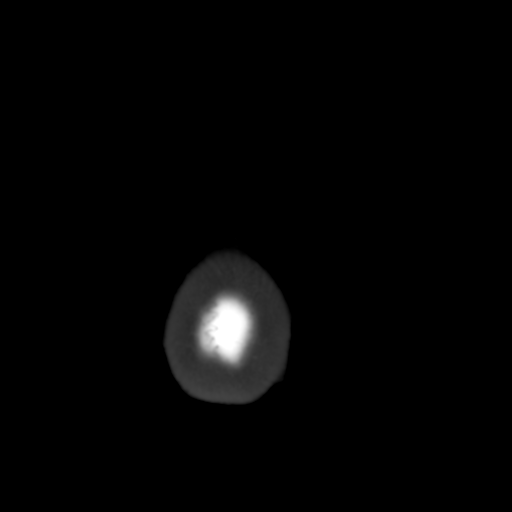

[15 of 30 positions shown; findings below may reference images not displayed]

FINDINGS: Negative noncontrast brain parenchyma. Visualized orbits and scalp
soft tissues are within normal limits. Negative visualized
noncontrast deep soft tissue spaces of the face.

Bilateral tympanic cavities and mastoids are clear.

Both sphenoid sinuses are clear, and mildly hyperplastic (coronal
image 48).

The left ethmoid air cells are clear. There is isolated mild mucosal
thickening in a right ethmoid air cell (coronal image 31). The
anterior ethmoidal artery position is felt to beyond coronal images
35 and 36.

Both frontal sinuses are clear.

Both maxillary sinuses are clear. Both OMCs are patent (coronal
image 31).

Rightward nasal septal deviation and spurring. Symmetric appearing
nasal cavity mucosal thickening. Small right middle concha bullosa
with paradoxical rotation of the right middle turbinate. See coronal
image 35.

Incidental torus palatinus. No acute osseous abnormality identified.
IMPRESSION: 1. Largely normal paranasal sinuses. Isolated mild mucosal
thickening in a right ethmoid air cell.
2. Rightward nasal septal deviation and spurring. Small right middle
concha bullosa and paradoxical rotation of the right middle
turbinate. Symmetric appearing nasal cavity mucosal thickening
raising the possibility of rhinitis.

## 2017-06-10 LAB — COMPREHENSIVE DRUG ANALYSIS,UR

## 2017-07-01 ENCOUNTER — Encounter: Payer: Self-pay | Admitting: Family Medicine

## 2017-07-12 ENCOUNTER — Encounter: Payer: Self-pay | Admitting: Neurology

## 2017-07-17 ENCOUNTER — Encounter: Payer: Self-pay | Admitting: Neurology

## 2017-07-17 ENCOUNTER — Telehealth: Payer: Self-pay | Admitting: Neurology

## 2017-07-17 DIAGNOSIS — G47419 Narcolepsy without cataplexy: Secondary | ICD-10-CM

## 2017-07-17 MED ORDER — MODAFINIL 100 MG PO TABS: 100.0000 mg | ORAL_TABLET | Freq: Two times a day (BID) | ORAL | 5 refills | Status: DC

## 2017-07-17 MED ORDER — MODAFINIL 100 MG PO TABS: 100.0000 mg | ORAL_TABLET | Freq: Every day | ORAL | 5 refills | Status: DC

## 2017-07-17 NOTE — Telephone Encounter (Signed)
As per email conversations, will order MRI brain w and w/o contrast for new Dx of narcolepsy and recent mood related changes.  Also will change Rx from Nuvigil 150 mg daily to provigil 100 mg bid.  I will email her back.

## 2017-07-17 NOTE — Telephone Encounter (Signed)
Rx for Provigil corrected. Pls. Fax to [facility]

## 2017-07-17 NOTE — Telephone Encounter (Signed)
[facility]. Received a receipt of confirmation.

## 2017-07-21 NOTE — Telephone Encounter (Signed)
Patient is schedule to have her MRI done at [facility] mobile unit for Tuesday 07/22/17.

## 2017-07-22 ENCOUNTER — Other Ambulatory Visit: Payer: BLUE CROSS/BLUE SHIELD

## 2017-08-06 DIAGNOSIS — R4781 Slurred speech: Secondary | ICD-10-CM | POA: Diagnosis not present

## 2017-08-06 DIAGNOSIS — R413 Other amnesia: Secondary | ICD-10-CM | POA: Diagnosis not present

## 2017-08-06 DIAGNOSIS — Z23 Encounter for immunization: Secondary | ICD-10-CM | POA: Diagnosis not present

## 2017-08-06 DIAGNOSIS — R4182 Altered mental status, unspecified: Secondary | ICD-10-CM | POA: Diagnosis not present

## 2017-08-17 ENCOUNTER — Encounter: Payer: Self-pay | Admitting: Neurology

## 2017-08-18 ENCOUNTER — Telehealth: Payer: Self-pay

## 2017-08-18 NOTE — Telephone Encounter (Signed)
I called pt. I advised her that her MRI was within normal limits. I reminded pt to keep her appt on 09/04/17. Pt verbalized understanding of results. Pt had no questions at this time but was encouraged to call back if questions arise.

## 2017-08-18 NOTE — Telephone Encounter (Signed)
Received mychart message from this pt: " I was wondering about the MRI results. I had one a week from last Wednesday.  I didn't have it done with you so I'm hoping [name] sent you the images.  I have been taking the new medication. It feels like the same as the other one but I am grateful for the little boost in the afternoon/night. I know that this medication will not be the answer but I have a follow-up on the 15th so we can see at that time.   Thank you,  Elizabeth [name]     I have not seen any MRI results yet. Will ask our MRI coordinator to look into this.

## 2017-08-18 NOTE — Telephone Encounter (Signed)
MRI report reviewed: Please call and advise the patient that the recent scan we did was within normal limits. We did a brain MRI with and wo contrast, which showed normal findings. In particular, there were no acute findings, such as a stroke, or mass or blood products. No further action is required on this test at this time. Please remind patient to keep any upcoming appointments or tests and to call us with any interim questions, concerns, problems or updates. Thanks,

## 2017-09-04 ENCOUNTER — Encounter: Payer: Self-pay | Admitting: Adult Health

## 2017-09-04 ENCOUNTER — Ambulatory Visit: Payer: BLUE CROSS/BLUE SHIELD | Admitting: Adult Health

## 2017-09-04 VITALS — BP 126/66 | HR 71 | Wt 134.0 lb

## 2017-09-04 DIAGNOSIS — G47411 Narcolepsy with cataplexy: Secondary | ICD-10-CM

## 2017-09-04 MED ORDER — AMPHETAMINE-DEXTROAMPHETAMINE 10 MG PO TABS
ORAL_TABLET | ORAL | 0 refills | Status: DC
Start: 2017-09-04 — End: 2017-10-03

## 2017-09-04 NOTE — Patient Instructions (Addendum)
Your Plan:  Continue Provigil  Start Adderall 10 mg in the morning. May take an additional 10 mg at noon if needed.  If your symptoms worsen or you develop new symptoms please let us know.   Thank you for coming to see [place] at [facility]. I hope we have been able to provide you high quality care today.  You may receive a patient satisfaction survey over the next few weeks. We would appreciate your feedback and comments so that we may continue to improve ourselves and the health of our patients.  Amphetamine; Dextroamphetamine tablets What is this medicine? AMPHETAMINE; DEXTROAMPHETAMINE(am FET a meen; dex troe am FET a meen) is used to treat attention-deficit hyperactivity disorder (ADHD). It may also be used for narcolepsy. Federal law prohibits giving this medicine to any person other than the person for whom it was prescribed. Do not share this medicine with anyone else. This medicine may be used for other purposes; ask your health care provider or pharmacist if you have questions. COMMON BRAND NAME(S): Adderall What should I tell my health care provider before I take this medicine? They need to know if you have any of these conditions: -anxiety or panic attacks -circulation problems in fingers and toes -glaucoma -hardening or blockages of the arteries or heart blood vessels -heart disease or a heart defect -high blood pressure -history of a drug or alcohol abuse problem -history of stroke -kidney disease -liver disease -mental illness -seizures -suicidal thoughts, plans, or attempt; a previous suicide attempt by you or a family member -thyroid disease -Tourette's syndrome -an unusual or allergic reaction to dextroamphetamine, other amphetamines, other medicines, foods, dyes, or preservatives -pregnant or trying to get pregnant -breast-feeding How should I use this medicine? Take this medicine by mouth with a glass of water. Follow the directions on the  prescription label. Take your doses at regular intervals. Do not take your medicine more often than directed. Do not suddenly stop your medicine. You must gradually reduce the dose or you may feel withdrawal effects. Ask your doctor or health care professional for advice. Talk to your pediatrician regarding the use of this medicine in children. Special care may be needed. While this drug may be prescribed for children as young as 3 years for selected conditions, precautions do apply. Overdosage: If you think you have taken too much of this medicine contact a poison control center or emergency room at once. NOTE: This medicine is only for you. Do not share this medicine with others. What if I miss a dose? If you miss a dose, take it as soon as you can. If it is almost time for your next dose, take only that dose. Do not take double or extra doses. What may interact with this medicine? Do not take this medicine with any of the following medications: -MAOIS like Carbex, Eldepryl, Marplan, Nardil, and Parnate -other stimulant medicines for attention disorders, weight loss, or to stay awake This medicine may also interact with the following medications: -acetazolamide -ammonium chloride -antacids -ascorbic acid -atomoxetine -caffeine -certain medicines for blood pressure -certain medicines for depression, anxiety, or psychotic disturbances -certain medicines for seizures like carbamazepine, phenobarbital, phenytoin -certain medicines for stomach problems like cimetidine, famotidine, omeprazole, lansoprazole -cold or allergy medicines -glutamic acid -lithium -meperidine -methenamine; sodium acid phosphate -narcotic medicines for pain -norepinephrine -phenothiazines like chlorpromazine, mesoridazine, prochlorperazine, thioridazine -sodium acid phosphate -sodium bicarbonate This list may not describe all possible interactions. Give your health care provider a list of all the  medicines, herbs,  non-prescription drugs, or dietary supplements you use. Also tell them if you smoke, drink alcohol, or use illegal drugs. Some items may interact with your medicine. What should I watch for while using this medicine? Visit your doctor or health care professional for regular checks on your progress. This prescription requires that you follow special procedures with your doctor and pharmacy. You will need to have a new written prescription from your doctor every time you need a refill. This medicine may affect your concentration, or hide signs of tiredness. Until you know how this medicine affects you, do not drive, ride a bicycle, use machinery, or do anything that needs mental alertness. Tell your doctor or health care professional if this medicine loses its effects, or if you feel you need to take more than the prescribed amount. Do not change the dosage without talking to your doctor or health care professional. Decreased appetite is a common side effect when starting this medicine. Eating small, frequent meals or snacks can help. Talk to your doctor if you continue to have poor eating habits. Height and weight growth of a child taking this medicine will be monitored closely. Do not take this medicine close to bedtime. It may prevent you from sleeping. If you are going to need surgery, a MRI, CT scan, or other procedure, tell your doctor that you are taking this medicine. You may need to stop taking this medicine before the procedure. Tell your doctor or healthcare professional right away if you notice unexplained wounds on your fingers and toes while taking this medicine. You should also tell your healthcare provider if you experience numbness or pain, changes in the skin color, or sensitivity to temperature in your fingers or toes. What side effects may I notice from receiving this medicine? Side effects that you should report to your doctor or health care professional as soon as possible: -allergic  reactions like skin rash, itching or hives, swelling of the face, lips, or tongue -changes in vision -chest pain or chest tightness -confusion, trouble speaking or understanding -fast, irregular heartbeat -fingers or toes feel numb, cool, painful -hallucination, loss of contact with reality -high blood pressure -males: prolonged or painful erection -seizures -severe headaches -shortness of breath -suicidal thoughts or other mood changes -trouble walking, dizziness, loss of balance or coordination -uncontrollable head, mouth, neck, arm, or leg movements Side effects that usually do not require medical attention (report to your doctor or health care professional if they continue or are bothersome): -anxious -headache -loss of appetite -nausea, vomiting -trouble sleeping -weight loss This list may not describe all possible side effects. Call your doctor for medical advice about side effects. You may report side effects to FDA at 1-800-FDA-1088. Where should I keep my medicine? Keep out of the reach of children. This medicine can be abused. Keep your medicine in a safe place to protect it from theft. Do not share this medicine with anyone. Selling or giving away this medicine is dangerous and against the law. Store at room temperature between 15 and 30 degrees C (59 and 86 degrees F). Keep container tightly closed. Throw away any unused medicine after the expiration date. Dispose of properly. This medicine may cause accidental overdose and death if it is taken by other adults, children, or pets. Mix any unused medicine with a substance like cat litter or coffee grounds. Then throw the medicine away in a sealed container like a sealed bag or a coffee can with a lid. Do not  use the medicine after the expiration date. NOTE: This sheet is a summary. It may not cover all possible information. If you have questions about this medicine, talk to your doctor, pharmacist, or health care provider.  2018  Elsevier/Gold Standard (2014-08-10 18:44:41)

## 2017-09-04 NOTE — Progress Notes (Addendum)
PATIENT: Elizabeth [name] DOB: [birth year]  REASON FOR VISIT: follow up HISTORY FROM: patient  HISTORY OF PRESENT ILLNESS: Today 09/04/17 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  She returns today for follow-up.  She was switched to Provigil.  She feels that this is not offering her maximal benefit.  She states that she continues to have to take several naps throughout the day.  She finds that when she is on her menstrual cycle she is much sleepier.  She states that when she takes her second dose she does not feel that she is taking any medication.  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  She does feel that she has had some cataplectic events.  She states that they were recently in a stressful situation she found that she was dropping things and her knees were buckling.  She states that she does not want to go on any antidepressants at this time.  Patient is interested in trying a stimulant.  She reports that she read over Xyrem but is not sure that she is ready to try this medication.  She denies any cardiac history.  She states that she is going to perimenopause which is causing some mood changes.  She returns today for an evaluation.  HISTORY 05/28/2017: She reports doing about the same. Looking back, she feels like she may have had a few instances of possible cataplexy. She also recalls sometimes waking up from a nap as she heard something or she saw something and when she gets tired after exercising she has a tendency to see things that are not real. She decided to stay off of her Wellbutrin and Lexapro after she tapered off the medications for her sleep study. She feels no significant repercussions as to her depression or anxiety at this time, in fact she feels a little better overall, knowing that she has a real conditions that could explain her sluggishness in thinking, her sleepiness, her fatigue, feeling  foggy headed at times.  The patient's allergies, current medications, family history, past medical history, past social history, past surgical history and problem list were reviewed and updated as appropriate.    REVIEW OF SYSTEMS: Out of a complete 14 system review of symptoms, the patient complains only of the following symptoms, and all other reviewed systems are negative.  ALLERGIES: Allergies  Allergen Reactions  . Sulfa Antibiotics     HOME MEDICATIONS: Outpatient Medications Prior to Visit  Medication Sig Dispense Refill  . ibuprofen (ADVIL,MOTRIN) 200 MG tablet Take 200 mg by mouth as needed.    . modafinil (PROVIGIL) 100 MG tablet Take 1 tablet (100 mg total) by mouth 2 (two) times daily. Second dose no later than 3PM 60 tablet 5   No facility-administered medications prior to visit.     PAST MEDICAL HISTORY: Past Medical History:  Diagnosis Date  . Allergy   . Anemia   . Anxiety state 02/15/2016  . Arthritis   . Depression     PAST SURGICAL HISTORY: Past Surgical History:  Procedure Laterality Date  . CESAREAN SECTION     X2  . DILATION AND CURETTAGE OF UTERUS    . NASAL SEPTUM SURGERY      FAMILY HISTORY: Family History  Problem Relation Age of Onset  . Heart disease Mother   . Depression Mother   . Anxiety disorder Mother   . Diabetes Father   .  Depression Father   . Anxiety disorder Father   . Stroke Maternal Grandmother   . Parkinsonism Paternal Grandmother   . Breast cancer Paternal Grandmother   . Bladder Cancer Paternal Grandfather     SOCIAL HISTORY: Social History   Socioeconomic History  . Marital status: Married    Spouse name: Not on file  . Number of children: 2  . Years of education: Masters  . Highest education level: Not on file  Social Needs  . Financial resource strain: Not on file  . Food insecurity - worry: Not on file  . Food insecurity - inability: Not on file  . Transportation needs - medical: Not on file  .  Transportation needs - non-medical: Not on file  Occupational History  . Not on file  Tobacco Use  . Smoking status: Former Smoker    Last attempt to quit: 10/21/2004    Years since quitting: 12.8  . Smokeless tobacco: Never Used  . Tobacco comment: quit 11 yrs ago  Substance and Sexual Activity  . Alcohol use: No    Alcohol/week: 0.0 oz  . Drug use: Not on file  . Sexual activity: Yes    Birth control/protection: Surgical  Other Topics Concern  . Not on file  Social History Narrative   2 caffeine drinks a day       PHYSICAL EXAM  Vitals:   09/04/17 1244  BP: 126/66  Pulse: 71  Weight: 134 lb (60.8 kg)   Body mass index is 22.3 kg/m.  Generalized: Well developed, in no acute distress   Neurological examination  Mentation: Alert oriented to time, place, history taking. Follows all commands speech and language fluent Cranial nerve II-XII: Pupils were equal round reactive to light. Extraocular movements were full, visual field were full on confrontational test. Facial sensation and strength were normal. Uvula tongue midline. Head turning and shoulder shrug  were normal and symmetric. Motor: The motor testing reveals 5 over 5 strength of all 4 extremities. Good symmetric motor tone is noted throughout.  Sensory: Sensory testing is intact to soft touch on all 4 extremities. No evidence of extinction is noted.  Coordination: Cerebellar testing reveals good finger-nose-finger and heel-to-shin bilaterally.  Gait and station: Gait is normal. Tandem gait is normal. Romberg is negative. No drift is seen.  Reflexes: Deep tendon reflexes are symmetric and normal bilaterally.   DIAGNOSTIC DATA (LABS, IMAGING, TESTING) - I reviewed patient records, labs, notes, testing and imaging myself where available.  Lab Results  Component Value Date   WBC 5.8 02/06/2017   HGB 11.7 (L) 02/06/2017   HCT 34.2 (L) 02/06/2017   MCV 91.1 02/06/2017   PLT 258.0 02/06/2017      Component Value  Date/Time   NA 140 02/06/2017 1430   K 3.8 02/06/2017 1430   CL 103 02/06/2017 1430   CO2 31 02/06/2017 1430   GLUCOSE 88 02/06/2017 1430   BUN 10 02/06/2017 1430   CREATININE 0.77 02/06/2017 1430   CALCIUM 9.0 02/06/2017 1430   PROT 7.0 02/06/2017 1430   ALBUMIN 4.3 02/06/2017 1430   AST 13 02/06/2017 1430   ALT 8 02/06/2017 1430   ALKPHOS 44 02/06/2017 1430   BILITOT 0.3 02/06/2017 1430   Lab Results  Component Value Date   CHOL 151 02/15/2016   HDL 57.00 02/15/2016   LDLCALC 81 02/15/2016   TRIG 63.0 02/15/2016   CHOLHDL 3 02/15/2016   Lab Results  Component Value Date   HGBA1C 5.5 02/15/2016  No results found for: VITAMINB12 Lab Results  Component Value Date   TSH 1.33 02/06/2017      ASSESSMENT AND PLAN 41 y.o. year old female  has a past medical history of Allergy, Anemia, Anxiety state (02/15/2016), Arthritis, and Depression. here with:  1. Narcolepsy with cataplexy  Patient will continue on Provigil.  We discussed potentially starting Adderall I have reviewed side effects with the patient she is amenable to trying this medication.  She will start by taking 10 mg in the morning.  If she tolerates this well but it is not effective she can take an additional dose around noon.  Patient voiced understanding.  Patient is advised that if her symptoms worsen or she develops new symptoms she should let us know.  She will follow-up in 3 months or sooner if needed.    Ward Givens, MSN, NP-C 09/04/2017, 11:56 AM [facility] [address] [place], [place] [address] [phone]  I reviewed the above note and documentation by the Nurse Practitioner and agree with the history, physical exam, assessment and plan as outlined above. I was immediately available for face-to-face consultation. Star Age, MD, PhD [facility])

## 2017-09-08 ENCOUNTER — Encounter: Payer: Self-pay | Admitting: Adult Health

## 2017-09-08 NOTE — Telephone Encounter (Signed)
LVM informing patient that per Edman Circle, NP what she described in her my chart message isn't cataplexy. Advised her cataplexy isn't prolonged, typically lasts < 2 minutes. Advised her NP stated she may take Modafinil 2 tabs in the morning as she asked. Left number for any questions.

## 2017-09-15 ENCOUNTER — Encounter: Payer: Self-pay | Admitting: Adult Health

## 2017-09-16 MED ORDER — MODAFINIL 100 MG PO TABS: ORAL_TABLET | ORAL | 5 refills | Status: DC

## 2017-09-16 NOTE — Telephone Encounter (Signed)
Modafinil refill Rx [facility]ed to [facility] [place].

## 2017-09-16 NOTE — Telephone Encounter (Signed)
Hey Elizabeth [name],   It is okay to take modafinil 200 mg in the morning and 100 mg at 3 PM.  For now I would try to maintain this dose.  I will send in a prescription for modafinil.  I will print Adderall prescription today.  You can pick up the prescription anytime tomorrow between 8 and 5 PM.

## 2017-10-02 DIAGNOSIS — L814 Other melanin hyperpigmentation: Secondary | ICD-10-CM | POA: Diagnosis not present

## 2017-10-02 DIAGNOSIS — L659 Nonscarring hair loss, unspecified: Secondary | ICD-10-CM | POA: Diagnosis not present

## 2017-10-03 ENCOUNTER — Other Ambulatory Visit: Payer: Self-pay | Admitting: Adult Health

## 2017-10-03 MED ORDER — AMPHETAMINE-DEXTROAMPHETAMINE 10 MG PO TABS: ORAL_TABLET | ORAL | 0 refills | Status: DC

## 2017-10-03 NOTE — Telephone Encounter (Signed)
Adderall refill Rx at front desk for pick up.

## 2017-10-06 ENCOUNTER — Other Ambulatory Visit: Payer: Self-pay | Admitting: Adult Health

## 2017-10-06 ENCOUNTER — Encounter: Payer: Self-pay | Admitting: Adult Health

## 2017-10-09 ENCOUNTER — Telehealth: Payer: Self-pay | Admitting: *Deleted

## 2017-10-09 ENCOUNTER — Ambulatory Visit: Payer: BLUE CROSS/BLUE SHIELD | Admitting: Family Medicine

## 2017-10-09 ENCOUNTER — Encounter: Payer: Self-pay | Admitting: Family Medicine

## 2017-10-09 VITALS — BP 108/70 | HR 77 | Temp 98.4°F | Ht 65.0 in | Wt 127.5 lb

## 2017-10-09 DIAGNOSIS — J0100 Acute maxillary sinusitis, unspecified: Secondary | ICD-10-CM | POA: Diagnosis not present

## 2017-10-09 MED ORDER — AMOXICILLIN-POT CLAVULANATE 875-125 MG PO TABS: 1.0000 | ORAL_TABLET | Freq: Two times a day (BID) | ORAL | 0 refills | Status: DC

## 2017-10-09 NOTE — Telephone Encounter (Signed)
Received request for Medical Records of Lab results [facility] only] with signed release; request faxed to [facility] 12/20

## 2017-10-09 NOTE — Progress Notes (Signed)
Chief Complaint  Patient presents with  . Sinusitis    [facility] here for URI complaints.  Duration: 2 weeks  Associated symptoms: sinus congestion, sinus pain, upper dental pain, and sore throat Denies: rhinorrhea, itchy watery eyes, ear pain, ear drainage, shortness of breath, myalgia and fevers/rigors Treatment to date: Ibuprofen Sick contacts: No  ROS:  Const: Denies fevers HEENT: As noted in HPI Lungs: No SOB  Past Medical History:  Diagnosis Date  . Allergy   . Anemia   . Anxiety state 02/15/2016  . Arthritis   . Depression    Family History  Problem Relation Age of Onset  . Heart disease Mother   . Depression Mother   . Anxiety disorder Mother   . Diabetes Father   . Depression Father   . Anxiety disorder Father   . Stroke Maternal Grandmother   . Parkinsonism Paternal Grandmother   . Breast cancer Paternal Grandmother   . Bladder Cancer Paternal Grandfather     BP 108/70 (BP Location: Left Arm, Patient Position: Sitting, Cuff Size: Normal)   Pulse 77   Temp 98.4 F (36.9 C) (Oral)   Ht 5\' 5"  (1.651 m)   Wt 127 lb 8 oz (57.8 kg)   SpO2 98%   BMI 21.22 kg/m  General: Awake, alert, appears stated age HEENT: AT, [place], ears patent b/l and TM's neg, +TTP over max sinuses, nares patent w/o discharge, pharynx pink and without exudates, MMM Neck: No masses or asymmetry Heart: RRR, no murmurs, no bruits Lungs: CTAB, no accessory muscle use Psych: Age appropriate judgment and insight, normal mood and affect  Acute maxillary sinusitis, recurrence not specified - Plan: amoxicillin-clavulanate (AUGMENTIN) 875-125 MG tablet  Given duration, will tx.  Continue to push fluids, practice good hand hygiene, cover mouth when coughing. F/u prn. If starting to experience fevers, shaking, or shortness of breath, seek immediate care. Pt voiced understanding and agreement to the plan.  [place], DO 10/09/17 11:42 AM

## 2017-10-09 NOTE — Patient Instructions (Signed)
Continue to push fluids, practice good hand hygiene, and cover your mouth if you cough.  If you start having fevers, shaking or shortness of breath, seek immediate care.  Let us know if you need anything.  

## 2017-10-09 NOTE — Progress Notes (Signed)
Pre visit review using our clinic review tool, if applicable. No additional management support is needed unless otherwise documented below in the visit note. 

## 2017-10-10 ENCOUNTER — Ambulatory Visit: Payer: BLUE CROSS/BLUE SHIELD | Admitting: Obstetrics & Gynecology

## 2017-10-10 DIAGNOSIS — L659 Nonscarring hair loss, unspecified: Secondary | ICD-10-CM | POA: Diagnosis not present

## 2017-10-16 ENCOUNTER — Encounter: Payer: Self-pay | Admitting: Family Medicine

## 2017-10-16 ENCOUNTER — Other Ambulatory Visit: Payer: Self-pay | Admitting: Family Medicine

## 2017-10-16 MED ORDER — DOXYCYCLINE HYCLATE 100 MG PO TABS: 100.0000 mg | ORAL_TABLET | Freq: Two times a day (BID) | ORAL | 0 refills | Status: DC

## 2017-10-22 ENCOUNTER — Ambulatory Visit: Payer: BLUE CROSS/BLUE SHIELD | Admitting: Obstetrics & Gynecology

## 2017-10-22 VITALS — BP 133/80 | HR 83 | Wt 128.0 lb

## 2017-10-22 DIAGNOSIS — N951 Menopausal and female climacteric states: Secondary | ICD-10-CM

## 2017-10-22 DIAGNOSIS — R4586 Emotional lability: Secondary | ICD-10-CM

## 2017-10-22 DIAGNOSIS — G4700 Insomnia, unspecified: Secondary | ICD-10-CM

## 2017-10-22 MED ORDER — LO LOESTRIN FE 1 MG-10 MCG / 10 MCG PO TABS: 1.0000 | ORAL_TABLET | Freq: Every day | ORAL | 3 refills | Status: DC

## 2017-10-22 NOTE — Patient Instructions (Signed)
Begin Lo loestrin: to start begin with 2 pills a day for 3 days then take one pill per day (from there on)  Oral Contraception Information Oral contraceptive pills (OCPs) are medicines taken to prevent pregnancy. OCPs work by preventing the ovaries from releasing eggs. The hormones in OCPs also cause the cervical mucus to thicken, preventing the sperm from entering the uterus. The hormones also cause the uterine lining to become thin, not allowing a fertilized egg to attach to the inside of the uterus. OCPs are highly effective when taken exactly as prescribed. However, OCPs do not prevent sexually transmitted diseases (STDs). Safe sex practices, such as using condoms along with the pill, can help prevent STDs. Before taking the pill, you may have a physical exam and Pap test. Your health care provider may order blood tests. The health care provider will make sure you are a good candidate for oral contraception. Discuss with your health care provider the possible side effects of the OCP you may be prescribed. When starting an OCP, it can take 2 to 3 months for the body to adjust to the changes in hormone levels in your body. Types of oral contraception  The combination pill-This pill contains estrogen and progestin (synthetic progesterone) hormones. The combination pill comes in 21-day, 28-day, or 91-day packs. Some types of combination pills are meant to be taken continuously (365-day pills). With 21-day packs, you do not take pills for 7 days after the last pill. With 28-day packs, the pill is taken every day. The last 7 pills are without hormones. Certain types of pills have more than 21 hormone-containing pills. With 91-day packs, the first 84 pills contain both hormones, and the last 7 pills contain no hormones or contain estrogen only.  The minipill-This pill contains the progesterone hormone only. The pill is taken every day continuously. It is very important to take the pill at the same time each  day. The minipill comes in packs of 28 pills. All 28 pills contain the hormone. Advantages of oral contraceptive pills  Decreases premenstrual symptoms.  Treats menstrual period cramps.  Regulates the menstrual cycle.  Decreases a heavy menstrual flow.  May treatacne, depending on the type of pill.  Treats abnormal uterine bleeding.  Treats polycystic ovarian syndrome.  Treats endometriosis.  Can be used as emergency contraception. Things that can make oral contraceptive pills less effective OCPs can be less effective if:  You forget to take the pill at the same time every day.  You have a stomach or intestinal disease that lessens the absorption of the pill.  You take OCPs with other medicines that make OCPs less effective, such as antibiotics, certain HIV medicines, and some seizure medicines.  You take expired OCPs.  You forget to restart the pill on day 7, when using the packs of 21 pills.  Risks associated with oral contraceptive pills Oral contraceptive pills can sometimes cause side effects, such as:  Headache.  Nausea.  Breast tenderness.  Irregular bleeding or spotting.  Combination pills are also associated with a small increased risk of:  Blood clots.  Heart attack.  Stroke.  This information is not intended to replace advice given to you by your health care provider. Make sure you discuss any questions you have with your health care provider. Document Released: 12/28/2002 Document Revised: 03/14/2016 Document Reviewed: 03/28/2013 Elsevier Interactive Patient Education  [employer]. .

## 2017-10-22 NOTE — Progress Notes (Signed)
Subjective:     Elizabeth [name] is a 42 y.o. female here for a routine exam. LMP irreg cycles. Menses [name] 29, Jul 29, Sept 1, Sept 24, Oct 22, Nov 11 and Dec 10. Current complaints: insomnia. Pt was seen for a sleep study and dx'd with narcolepsy. She is on her 3rd course of meds and feels that she is a bit better. She is getting about 7-8 hours of sleep per night. She has been seen by derm for hair loss issues.  Pt feels that her sleep issues and mood issues are worse since she has started perimenopause. Her mother went through early menopause.     Gynecologic History Patient's last menstrual period was 10/22/2017. Last Pap: 04/07/2017. Results were: normal Contraception: vasectomy   Obstetric History OB History  Gravida Para Term Preterm AB Living  4       2    SAB TAB Ectopic Multiple Live Births  2       2    # Outcome Date GA Lbr Len/2nd Weight Sex Delivery Anes PTL Lv  4 Gravida           3 Gravida           2 SAB           1 SAB              The following portions of the patient's history were reviewed and updated as appropriate: allergies, current medications, past family history, past medical history, past social history, past surgical history and problem list.  Review of Systems Pertinent items are noted in HPI.    Objective:  BP 133/80   Pulse 83   Wt 128 lb (58.1 kg)   LMP 10/22/2017   BMI 21.30 kg/m   CONSTITUTIONAL: Well-developed, well-nourished female in no acute distress.  HENT:  Normocephalic, atraumatic EYES: Conjunctivae and EOM are normal. No scleral icterus.  NECK: Normal range of motion SKIN: Skin is warm and dry. No rash noted. Not diaphoretic.No pallor. [place]: Alert and oriented to person, place, and time. Normal coordination.     Assessment:   Perimenopausal state. Reviewed expectant management vs pharmacologic management with ERT.  Pt wants to try OCPs.     Plan:    Lo loestrin 1 po q day  F/u in 3 months or sooner prn Reviewed risks  and benefis of OCPs No contraindications for OCPs.   Total face-to-face time with patient was 20 min.  Greater than 50% was spent in counseling and coordination of care with the patient.   Elizabeth [name], M.D., Elizabeth [name]

## 2017-10-23 ENCOUNTER — Encounter: Payer: Self-pay | Admitting: Obstetrics & Gynecology

## 2017-10-28 ENCOUNTER — Encounter: Payer: Self-pay | Admitting: Family Medicine

## 2017-11-11 ENCOUNTER — Other Ambulatory Visit: Payer: Self-pay | Admitting: Adult Health

## 2017-11-11 ENCOUNTER — Encounter: Payer: Self-pay | Admitting: Adult Health

## 2017-11-12 MED ORDER — AMPHETAMINE-DEXTROAMPHETAMINE 10 MG PO TABS: ORAL_TABLET | ORAL | 0 refills | Status: DC

## 2017-11-12 NOTE — Telephone Encounter (Signed)
Adderall refill RX at front desk for pick up.

## 2017-11-25 ENCOUNTER — Encounter: Payer: Self-pay | Admitting: Adult Health

## 2017-12-02 MED ORDER — AMPHETAMINE-DEXTROAMPHETAMINE 10 MG PO TABS: ORAL_TABLET | ORAL | 0 refills | Status: DC

## 2017-12-02 NOTE — Addendum Note (Signed)
Addended by: Brandon Melnick on: 12/02/2017 11:54 AM   Modules accepted: Orders

## 2017-12-03 ENCOUNTER — Ambulatory Visit: Payer: BLUE CROSS/BLUE SHIELD | Admitting: Podiatry

## 2017-12-03 DIAGNOSIS — M21969 Unspecified acquired deformity of unspecified lower leg: Secondary | ICD-10-CM | POA: Diagnosis not present

## 2017-12-03 DIAGNOSIS — L851 Acquired keratosis [keratoderma] palmaris et plantaris: Secondary | ICD-10-CM | POA: Diagnosis not present

## 2017-12-03 DIAGNOSIS — M7741 Metatarsalgia, right foot: Secondary | ICD-10-CM

## 2017-12-03 NOTE — Patient Instructions (Signed)
Seen for painful feet.  Has unstable ankle joint. May benefit from using elastic ankle support. Both feet casted for orthotics. Will call with insurance info.

## 2017-12-03 NOTE — Progress Notes (Signed)
Been having some pain at lateral column during ambulation. Subjective: 42 y.o. year old female patient presents complaining of painful feet and request for custom orthotics. Stated that she is not wearing Metatarsal binder. Having pain at the lateral column of the foot R>L. Also hurts under the ball of 2nd MPJ area bilateral R>L. Patient was advised for Orthotic treatment during her last visit on 05/08/17.  Objective: Dermatologic: Plantar keratosis sub 2 right. Vascular: Pedal pulses are all palpable. Orthopedic: Excess sagittal plane motion first ray bilateral. Contracted 4th digit at DIPJ right.  Neurologic: All epicritic and tactile sensations grossly intact. Radiographic examination done; Noted of laterally deviated forefoot with increased lateral deviation angle of CCJ on right. Noted of elongated 2nd and 3rd metatarsal bones bilateral in AP view. Contracted digits 4th and 5th bilateral. Fibular sesamoid position at 4 bilateral. Dorsally displaced 1st metatarsal bone bilateral.  Assessment: Hypermobile first ray bilateral. Pain under the 2nd MPJ right. Lesser metatarsalgia bilateral.  Treatment: Reviewed clinical findings and available treatment options. Both feet casted for Orthotics.

## 2017-12-04 ENCOUNTER — Telehealth: Payer: Self-pay | Admitting: *Deleted

## 2017-12-04 ENCOUNTER — Encounter: Payer: Self-pay | Admitting: Podiatry

## 2017-12-04 NOTE — Telephone Encounter (Addendum)
Patient has a $500 deductible. The insurance pays 80% once the deductible has been paid. No authorization is required.   Reference # for the call is [id number]  Called and left message on patients' vm to call the office back to receive the benefits in

## 2017-12-09 NOTE — Telephone Encounter (Signed)
Discussed information with pt. Since pt was already casted and order sent out to [facility] she can pay the full $300.00 at the time of pick up. Pt verbalized understanding.

## 2017-12-23 ENCOUNTER — Telehealth: Payer: Self-pay | Admitting: *Deleted

## 2017-12-23 NOTE — Telephone Encounter (Signed)
Left message for patient to pick up orthotics. Balance for full amount will be due. ($300)

## 2017-12-29 ENCOUNTER — Encounter: Payer: Self-pay | Admitting: Adult Health

## 2017-12-29 MED ORDER — AMPHETAMINE-DEXTROAMPHETAMINE 10 MG PO TABS: ORAL_TABLET | ORAL | 0 refills | Status: DC

## 2018-01-01 ENCOUNTER — Encounter: Payer: Self-pay | Admitting: Family Medicine

## 2018-01-07 ENCOUNTER — Ambulatory Visit: Payer: BLUE CROSS/BLUE SHIELD | Admitting: Adult Health

## 2018-01-07 ENCOUNTER — Encounter: Payer: Self-pay | Admitting: Adult Health

## 2018-01-07 VITALS — BP 124/76 | HR 88 | Wt 119.2 lb

## 2018-01-07 DIAGNOSIS — G47419 Narcolepsy without cataplexy: Secondary | ICD-10-CM | POA: Diagnosis not present

## 2018-01-07 MED ORDER — AMPHETAMINE-DEXTROAMPHETAMINE 10 MG PO TABS: 10.0000 mg | ORAL_TABLET | Freq: Every day | ORAL | 0 refills | Status: DC | PRN

## 2018-01-07 MED ORDER — AMPHETAMINE-DEXTROAMPHET ER 20 MG PO CP24: 20.0000 mg | ORAL_CAPSULE | Freq: Every day | ORAL | 0 refills | Status: DC

## 2018-01-07 NOTE — Progress Notes (Addendum)
PATIENT: Elizabeth [name] DOB: 08/04/1976  REASON FOR VISIT: follow up HISTORY FROM: patient  HISTORY OF PRESENT ILLNESS: Today 01/07/18:d Is a 42 year old female with a history of narcolepsy.  She returns today for follow-up.  She remains on Provigil and Adderall.  She is currently taking Adderall immediate release 20 mg in the morning and additional 10 mg at lunch.  She reports that this works well in the morning but that effect tends to wear off around lunchtime.  She states that she does take the 10 mg tablet but does not notice much of a difference in the afternoon.  She reports that she is tolerating the medication well.  Looking back at her chart she has lost approximately 15 pounds since her visit in November.  She states that she has been very busy with her children and simply does not make to eat like she should.  She reports that she would like to try extended release Adderall to see if this gives her more benefit in the afternoons.  She returns today for evaluation.   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  She returns today for follow-up.  She was switched to Provigil.  She feels that this is not offering her maximal benefit.  She states that she continues to have to take several naps throughout the day.  She finds that when she is on her menstrual cycle she is much sleepier.  She states that when she takes her second dose she does not feel that she is taking any medication.  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  She does feel that she has had some cataplectic events.  She states that they were recently in a stressful situation she found that she was dropping things and her knees were buckling.  She states that she does not want to go on any antidepressants at this time.  Patient is interested in trying a stimulant.  She reports that she read over Xyrem but is not sure that she  is ready to try this medication.  She denies any cardiac history.  She states that she is going to perimenopause which is causing some mood changes.  She returns today for an evaluation.  REVIEW OF SYSTEMS: Out of a complete 14 system review of symptoms, the patient complains only of the following symptoms, and all other reviewed systems are negative.  ALLERGIES: Allergies  Allergen Reactions  . Sulfa Antibiotics     HOME MEDICATIONS: Outpatient Medications Prior to Visit  Medication Sig Dispense Refill  . amphetamine-dextroamphetamine (ADDERALL) 10 MG tablet Take 2 tablets PO in the morning. May take 1 tablet at noon if needed. 90 tablet 0  . ibuprofen (ADVIL,MOTRIN) 200 MG tablet Take 200 mg by mouth as needed.    . modafinil (PROVIGIL) 100 MG tablet Take 200 mg (2 tablets) PO in the AM and 1 tablet after lunch no later than 3 PM. 90 tablet 5  . doxycycline (VIBRA-TABS) 100 MG tablet Take 1 tablet (100 mg total) by mouth 2 (two) times daily. 20 tablet 0  . LO LOESTRIN FE 1 MG-10 MCG / 10 MCG tablet Take 1 tablet by mouth daily. 1 Package 3   No facility-administered medications prior to visit.     PAST MEDICAL HISTORY: Past Medical History:  Diagnosis Date  . Allergy   . Anemia   . Anxiety state 02/15/2016  .  Arthritis   . Depression     PAST SURGICAL HISTORY: Past Surgical History:  Procedure Laterality Date  . CESAREAN SECTION     X2  . DILATION AND CURETTAGE OF UTERUS    . NASAL SEPTUM SURGERY    . ROOT CANAL     x 2    FAMILY HISTORY: Family History  Problem Relation Age of Onset  . Heart disease Mother   . Depression Mother   . Anxiety disorder Mother   . Diabetes Father   . Depression Father   . Anxiety disorder Father   . Stroke Maternal Grandmother   . Parkinsonism Paternal Grandmother   . Breast cancer Paternal Grandmother   . Bladder Cancer Paternal Grandfather     SOCIAL HISTORY: Social History   Socioeconomic History  . Marital status:  Married    Spouse name: Not on file  . Number of children: 2  . Years of education: Masters  . Highest education level: Not on file  Social Needs  . Financial resource strain: Not on file  . Food insecurity - worry: Not on file  . Food insecurity - inability: Not on file  . Transportation needs - medical: Not on file  . Transportation needs - non-medical: Not on file  Occupational History  . Not on file  Tobacco Use  . Smoking status: Former Smoker    Last attempt to quit: 10/21/2004    Years since quitting: 13.2  . Smokeless tobacco: Never Used  . Tobacco comment: quit 11 yrs ago  Substance and Sexual Activity  . Alcohol use: No    Alcohol/week: 0.0 oz  . Drug use: Not on file  . Sexual activity: Yes    Birth control/protection: Surgical  Other Topics Concern  . Not on file  Social History Narrative   2 caffeine drinks a day       PHYSICAL EXAM  Vitals:   01/07/18 1450  BP: 124/76  Pulse: 88  Weight: 119 lb 3.2 oz (54.1 kg)   Body mass index is 19.84 kg/m.  Generalized: Well developed, in no acute distress   Neurological examination  Mentation: Alert oriented to time, place, history taking. Follows all commands speech and language fluent Cranial nerve II-XII: Pupils were equal round reactive to light. Extraocular movements were full, visual field were full on confrontational test. Facial sensation and strength were normal. Uvula tongue midline. Head turning and shoulder shrug  were normal and symmetric. Motor: The motor testing reveals 5 over 5 strength of all 4 extremities. Good symmetric motor tone is noted throughout.  Sensory: Sensory testing is intact to soft touch on all 4 extremities. No evidence of extinction is noted.  Coordination: Cerebellar testing reveals good finger-nose-finger and heel-to-shin bilaterally.  Gait and station: Gait is normal. Tandem gait is normal. Romberg is negative. No drift is seen.  Reflexes: Deep tendon reflexes are symmetric and  normal bilaterally.   DIAGNOSTIC DATA (LABS, IMAGING, TESTING) - I reviewed patient records, labs, notes, testing and imaging myself where available.  Lab Results  Component Value Date   WBC 5.8 02/06/2017   HGB 11.7 (L) 02/06/2017   HCT 34.2 (L) 02/06/2017   MCV 91.1 02/06/2017   PLT 258.0 02/06/2017      Component Value Date/Time   NA 140 02/06/2017 1430   K 3.8 02/06/2017 1430   CL 103 02/06/2017 1430   CO2 31 02/06/2017 1430   GLUCOSE 88 02/06/2017 1430   BUN 10 02/06/2017 1430  CREATININE 0.77 02/06/2017 1430   CALCIUM 9.0 02/06/2017 1430   PROT 7.0 02/06/2017 1430   ALBUMIN 4.3 02/06/2017 1430   AST 13 02/06/2017 1430   ALT 8 02/06/2017 1430   ALKPHOS 44 02/06/2017 1430   BILITOT 0.3 02/06/2017 1430   Lab Results  Component Value Date   CHOL 151 02/15/2016   HDL 57.00 02/15/2016   LDLCALC 81 02/15/2016   TRIG 63.0 02/15/2016   CHOLHDL 3 02/15/2016   Lab Results  Component Value Date   HGBA1C 5.5 02/15/2016   No results found for: QASTMHDQ22 Lab Results  Component Value Date   TSH 1.33 02/06/2017      ASSESSMENT AND PLAN 42 y.o. year old female  has a past medical history of Allergy, Anemia, Anxiety state (02/15/2016), Arthritis, and Depression. here with:  1.  Narcolepsy  I will try the patient on Adderall extended release 20 mg in the morning.  She is advised that she can take Adderall immediate release 10 mg 1 tablet daily if needed.  The patient has lost approximately 15 pounds since her visit in November.  Advised that Adderall can cause weight loss.  Advised that if she continues to lose weight this medication will have to be discontinued.  I would like her to follow-up in 1 month.  If she has lost additional weight this medication will be discontinued.  I spent 15 minutes with the patient. 50% of this time was spent discussing Adderall.   Ward Givens, MSN, NP-C 01/07/2018, 3:00 PM [facility] [address] [place], [place] [address] [phone]  I reviewed the above note and documentation by the Nurse Practitioner and agree with the history, physical exam, assessment and plan as outlined above. I was immediately available for face-to-face consultation. Star Age, MD, PhD [facility])

## 2018-01-07 NOTE — Patient Instructions (Addendum)
Your Plan:  Try Adderall XR 20 mg daily Can take adderall 10 mg daily if needed Monitor weight   Thank you for coming to see [place] at [facility]. I hope we have been able to provide you high quality care today.  You may receive a patient satisfaction survey over the next few weeks. We would appreciate your feedback and comments so that we may continue to improve ourselves and the health of our patients.  Amphetamine; Dextroamphetamine tablets What is this medicine? AMPHETAMINE; DEXTROAMPHETAMINE(am FET a meen; dex troe am FET a meen) is used to treat attention-deficit hyperactivity disorder (ADHD). It may also be used for narcolepsy. Federal law prohibits giving this medicine to any person other than the person for whom it was prescribed. Do not share this medicine with anyone else. This medicine may be used for other purposes; ask your health care provider or pharmacist if you have questions. COMMON BRAND NAME(S): Adderall What should I tell my health care provider before I take this medicine? They need to know if you have any of these conditions: -anxiety or panic attacks -circulation problems in fingers and toes -glaucoma -hardening or blockages of the arteries or heart blood vessels -heart disease or a heart defect -high blood pressure -history of a drug or alcohol abuse problem -history of stroke -kidney disease -liver disease -mental illness -seizures -suicidal thoughts, plans, or attempt; a previous suicide attempt by you or a family member -thyroid disease -Tourette's syndrome -an unusual or allergic reaction to dextroamphetamine, other amphetamines, other medicines, foods, dyes, or preservatives -pregnant or trying to get pregnant -breast-feeding How should I use this medicine? Take this medicine by mouth with a glass of water. Follow the directions on the prescription label. Take your doses at regular intervals. Do not take your medicine more often than  directed. Do not suddenly stop your medicine. You must gradually reduce the dose or you may feel withdrawal effects. Ask your doctor or health care professional for advice. Talk to your pediatrician regarding the use of this medicine in children. Special care may be needed. While this drug may be prescribed for children as young as 3 years for selected conditions, precautions do apply. Overdosage: If you think you have taken too much of this medicine contact a poison control center or emergency room at once. NOTE: This medicine is only for you. Do not share this medicine with others. What if I miss a dose? If you miss a dose, take it as soon as you can. If it is almost time for your next dose, take only that dose. Do not take double or extra doses. What may interact with this medicine? Do not take this medicine with any of the following medications: -MAOIS like Carbex, Eldepryl, Marplan, Nardil, and Parnate -other stimulant medicines for attention disorders, weight loss, or to stay awake This medicine may also interact with the following medications: -acetazolamide -ammonium chloride -antacids -ascorbic acid -atomoxetine -caffeine -certain medicines for blood pressure -certain medicines for depression, anxiety, or psychotic disturbances -certain medicines for seizures like carbamazepine, phenobarbital, phenytoin -certain medicines for stomach problems like cimetidine, famotidine, omeprazole, lansoprazole -cold or allergy medicines -glutamic acid -lithium -meperidine -methenamine; sodium acid phosphate -narcotic medicines for pain -norepinephrine -phenothiazines like chlorpromazine, mesoridazine, prochlorperazine, thioridazine -sodium acid phosphate -sodium bicarbonate This list may not describe all possible interactions. Give your health care provider a list of all the medicines, herbs, non-prescription drugs, or dietary supplements you use. Also tell them if you smoke, drink alcohol,  or use illegal drugs. Some items may interact with your medicine. What should I watch for while using this medicine? Visit your doctor or health care professional for regular checks on your progress. This prescription requires that you follow special procedures with your doctor and pharmacy. You will need to have a new written prescription from your doctor every time you need a refill. This medicine may affect your concentration, or hide signs of tiredness. Until you know how this medicine affects you, do not drive, ride a bicycle, use machinery, or do anything that needs mental alertness. Tell your doctor or health care professional if this medicine loses its effects, or if you feel you need to take more than the prescribed amount. Do not change the dosage without talking to your doctor or health care professional. Decreased appetite is a common side effect when starting this medicine. Eating small, frequent meals or snacks can help. Talk to your doctor if you continue to have poor eating habits. Height and weight growth of a child taking this medicine will be monitored closely. Do not take this medicine close to bedtime. It may prevent you from sleeping. If you are going to need surgery, a MRI, CT scan, or other procedure, tell your doctor that you are taking this medicine. You may need to stop taking this medicine before the procedure. Tell your doctor or healthcare professional right away if you notice unexplained wounds on your fingers and toes while taking this medicine. You should also tell your healthcare provider if you experience numbness or pain, changes in the skin color, or sensitivity to temperature in your fingers or toes. What side effects may I notice from receiving this medicine? Side effects that you should report to your doctor or health care professional as soon as possible: -allergic reactions like skin rash, itching or hives, swelling of the face, lips, or tongue -changes in  vision -chest pain or chest tightness -confusion, trouble speaking or understanding -fast, irregular heartbeat -fingers or toes feel numb, cool, painful -hallucination, loss of contact with reality -high blood pressure -males: prolonged or painful erection -seizures -severe headaches -shortness of breath -suicidal thoughts or other mood changes -trouble walking, dizziness, loss of balance or coordination -uncontrollable head, mouth, neck, arm, or leg movements Side effects that usually do not require medical attention (report to your doctor or health care professional if they continue or are bothersome): -anxious -headache -loss of appetite -nausea, vomiting -trouble sleeping -weight loss This list may not describe all possible side effects. Call your doctor for medical advice about side effects. You may report side effects to FDA at 1-800-FDA-1088. Where should I keep my medicine? Keep out of the reach of children. This medicine can be abused. Keep your medicine in a safe place to protect it from theft. Do not share this medicine with anyone. Selling or giving away this medicine is dangerous and against the law. Store at room temperature between 15 and 30 degrees C (59 and 86 degrees F). Keep container tightly closed. Throw away any unused medicine after the expiration date. Dispose of properly. This medicine may cause accidental overdose and death if it is taken by other adults, children, or pets. Mix any unused medicine with a substance like cat litter or coffee grounds. Then throw the medicine away in a sealed container like a sealed bag or a coffee can with a lid. Do not use the medicine after the expiration date. NOTE: This sheet is a summary. It may not cover all  possible information. If you have questions about this medicine, talk to your doctor, pharmacist, or health care provider.  2018 Elsevier/Gold Standard (2014-08-10 18:44:41)

## 2018-01-13 ENCOUNTER — Encounter: Payer: Self-pay | Admitting: Adult Health

## 2018-01-22 NOTE — Telephone Encounter (Signed)
Patient left a messsage asking how much will she have to pay for the orthotics. Called and left message on patient's cell the balance due is $300

## 2018-02-08 ENCOUNTER — Encounter: Payer: Self-pay | Admitting: Adult Health

## 2018-02-09 ENCOUNTER — Encounter: Payer: Self-pay | Admitting: Adult Health

## 2018-02-09 ENCOUNTER — Other Ambulatory Visit: Payer: Self-pay | Admitting: *Deleted

## 2018-02-09 MED ORDER — AMPHETAMINE-DEXTROAMPHETAMINE 10 MG PO TABS: 10.0000 mg | ORAL_TABLET | Freq: Every day | ORAL | 0 refills | Status: DC | PRN

## 2018-02-09 MED ORDER — AMPHETAMINE-DEXTROAMPHET ER 20 MG PO CP24: 20.0000 mg | ORAL_CAPSULE | Freq: Every day | ORAL | 0 refills | Status: DC

## 2018-02-10 ENCOUNTER — Encounter: Payer: Self-pay | Admitting: *Deleted

## 2018-02-10 NOTE — Progress Notes (Signed)
Weight is stable from last visit. She sent email wanting to discuss xyrem. We will need to get her an office visit with MD for xyrem.

## 2018-02-10 NOTE — Progress Notes (Unsigned)
Pt arrived for weight check.  She weighs 120lbs. She did want noted that after she started the Adderall ER she came down with stomach bug and was ill for 4 days (vomiting).

## 2018-02-11 ENCOUNTER — Telehealth: Payer: Self-pay

## 2018-02-11 NOTE — Telephone Encounter (Signed)
I called pt, per Jinny Blossom, NP's request to schedule pt an appt with Dr. Rexene Alberts to discuss xyrem. Pt is agreeable to an appt on 03/02/18 at 9:30am. Pt verbalized understanding of appt date and time.

## 2018-02-11 NOTE — Progress Notes (Signed)
See telephone note from 02/11/18.

## 2018-03-02 ENCOUNTER — Ambulatory Visit: Payer: BLUE CROSS/BLUE SHIELD | Admitting: Neurology

## 2018-03-02 ENCOUNTER — Encounter: Payer: Self-pay | Admitting: Neurology

## 2018-03-02 VITALS — BP 131/77 | HR 90 | Ht 65.0 in | Wt 118.0 lb

## 2018-03-02 DIAGNOSIS — G47419 Narcolepsy without cataplexy: Secondary | ICD-10-CM

## 2018-03-02 MED ORDER — AMPHETAMINE-DEXTROAMPHETAMINE 10 MG PO TABS: 10.0000 mg | ORAL_TABLET | Freq: Every day | ORAL | 0 refills | Status: DC | PRN

## 2018-03-02 MED ORDER — AMPHETAMINE-DEXTROAMPHET ER 20 MG PO CP24: 20.0000 mg | ORAL_CAPSULE | Freq: Every day | ORAL | 0 refills | Status: DC

## 2018-03-02 NOTE — Patient Instructions (Addendum)
As discussed, we will start the process of getting you approved for Xyrem.   As discussed, Xyrem has to be taken with very mindful caution: Taking Xyrem correctly is key. This means, take it only when you are fully ready to fall asleep, while in bed and refrain from doing any other activities, even brushing  your teeth after taking your first dose. The second dose will be about 2-1/2 to 4 hours after his first dose. You can go to the bathroom before your 2nd dose. Take your first dose, when actually IN BED, ready to sleep. No sitting up in bed, NO reading, NO using the cell phone or computer, NO getting up to use the bathroom. Take care of everything BEFORE sleep time. Try NOT to skip the second dose as the Xyrem is not going to stay in your system long enough with only one dose. Do not drink alcohol with Xyrem. If you do drink Alcohol, you cannot take your Xyrem doses that night.   Xyrem can cause increase in depression and even suicidal thoughts or actions. Keep your counselor informed and we may need to involve a psychiatrist if needed.   You must not take ANY illicit drugs at this time, this includes Marijuana.    We will have to screen you from time to time for illicit drugs. We will NOT continue with Xyrem if your drug screen comes back positive for any illicit drugs.  Reduce the provigil to 1 pill 2 times a day, when you start the Xyrem. The Adderall XR and IR will stay the same for now.

## 2018-03-02 NOTE — Progress Notes (Signed)
Faxed completed and signed xyrem start form and titration schedule to X[facility]rem [facility] program. Received a receipt of confirmation.'

## 2018-03-02 NOTE — Progress Notes (Signed)
Subjective:    Patient ID: Elizabeth [name] is a 42 y.o. female.  HPI     Interim history:   Ms. Elizabeth [name] is a 42 year old right-handed woman with an underlying medical history of allergies, anemia, depression, and anxiety, who presents for follow-up consultation of her hypersomnolence disorder, narcolepsy without cataplexy. The patient is unaccompanied today. I last saw her on 05/28/2017, at which time we talked about her sleepiness history and reviewed her test results in detail. I suggested we start her on Nuvigil. I also provided her with information on Xyrem.  She emailed in the interim in September 2018 reporting that the Provigil was not working well enough, she also had some mood related side effects and reported feeling foggy headed. I ordered a brain MRI and we switched her over to Provigil.  She is a brain MRI with and without contrast through [facility] on 08/06/2017 and I reviewed results: IMPRESSION:  Normal MRI of the brain.  We called her with her test results.  She saw Ward Givens, nurse practitioner in the interim on 09/04/2017, at which time she had residual sleepiness while on Provigil and low-dose Adderall was added at the time.  Her Provigil was gradually increased as well as her immediate release Adderall. She had a follow-up appointment with Ward Givens on 01/07/2018, at which time she was noted to have lost weight the realm of 15 pounds. She was advised to consider Xyrem.  Today, 03/02/2018 (all dictated new, as well as above notes, some dictation done in note pad or Word, outside of chart, may appear as copied):  She reports that her long-acting stimulant has been helpful but it does not last. She is noticing mood irritability to the point where it has affected her marriage. 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 Both daughters have their own issues including Tourette syndrome and tic disorder and ADHD. This is creating already  quite a stressful environment for her. She may have developed a tic herself. She has a tendency to roll her eyes and open them forcefully. This may have become worse after starting the Adderall. Mood irritability may have become worse after the stimulant as well. She does not have any sustained issues with depression or anxiety. She is trying to keep a bedtime routine. She believes, her husband will be supportive. Currently, she is taking modafinil 2 pills around 8, at the latest at 9 AM along with her Adderall XR 20 mg strength. She takes her second dose of modafinil which is 1 pill around 3 PM along with Adderall IR 10 mg. She has read up on Xyrem, she would like to get started on it. She has several questions which I answered. She admits that she has had a hard time adjusting to the diagnosis although she knows that she had symptoms for most of her life, probably dating back to when she was 42 years old.   The patient's allergies, current medications, family history, past medical history, past social history, past surgical history and problem list were reviewed and updated as appropriate.    Previously (copied from previous notes for reference):    I first met her on 03/06/2017 at the request of her primary care physician, at which time she reported a longer standing history of daytime somnolence, dating back to even childhood, progressive with time, worse particularly in the previous 6 months. She also reported some parasomnias. I suggested we proceed extended sleep study testing in the form  of nocturnal polysomnogram, followed by a daytime nap study. She was advised to taper off her antidepressant medications. She had a baseline sleep study on 05/18/2017, followed by a 4 nap MSLT on 05/19/2017. I went over her test results with her in detail today. Sleep latency for the nighttime sleep study was 22 minutes, REM latency slightly reduced at 57.5 minutes. Sleep efficiency was 85.3%. She had an increased  percentage of REM sleep at 34.6%, normal percentage for slow-wave sleep, Daytime decreased percentage of stage II sleep, she had no significant sleep disordered breathing, AHI was 0 per hour, she did have near severe PLMS with an index of 48.8 per hour but no significant arousals. Next a nap study showed a mean sleep latency of 4.25 minutes with 2 REM onset naps. 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    03/06/2017: (She) reports a several year history of sleepiness, progressive, particularly worse in the past 6 months. She recalls being sleepy as a child even. She remembers falling asleep in class. She does not wake up with a sense of gasping. She has not been told that she has apneic breathing pauses. She does have nocturia once per night. She reports vivid dreams and a history of sleepwalking and sleep talking as well as night terrors. Her father has a history of parasomnias and vivid dreams as well. There is no family history of narcolepsy or sleep apnea. She suspects that her mom may have sleep apnea and needs to be tested for this. She denies restless leg symptoms. She has woken up occasionally with a headache. She tries to be in bed between 10:30 and 11. Wakeup time 6 AM. She has a difficult time waking up in the mornings. She feels like she has to take a nap on a daily basis. She will go to bed to take naps and sometimes sleeps for 1 or 2 hours. She reports having had dreams in her naps. She usually goes to her bedroom and takes a nap in her bed. She does not watch TV in her bedroom. At night, she has a difficult time going back to sleep once she is up. Sometimes it takes her an hour to fall asleep when she wants to take a nap. She does feel like she is tired enough to nap. She has dozed off at the wheel briefly. She denies symptoms of cataplexy or sleep paralysis or hypnagogic or hypnopompic hallucinations. She does report vivid dreams, no acting out of her dreams. She has a  longer standing history of anxiety disorder and was started on Wellbutrin and Lexapro 3 or 4 years ago which was very helpful. She and her family moved from [place] some 2 years ago. I reviewed your office note from 02/06/2017. Her Epworth sleepiness score is 15 out of 24 today, fatigue score is 55 out of 63. She lives at home with her husband and 2 daughters, ages 43 and 65. She home schools. She does not work outside the home. She quit smoking in 2005. She drinks alcohol in the form of wine, one glass per day. She drinks caffeine in the form of coffee, 2 cups per day, usually in the mornings. She has had intermittent snoring. She has a history of deviated septum and is scheduled to have septoplasty next month.  Her Past Medical History Is Significant For: Past Medical History:  Diagnosis Date  . Allergy   . Anemia   . Anxiety state 02/15/2016  . Arthritis   . Depression  Her Past Surgical History Is Significant For: Past Surgical History:  Procedure Laterality Date  . CESAREAN SECTION     X2  . DILATION AND CURETTAGE OF UTERUS    . NASAL SEPTUM SURGERY    . ROOT CANAL     x 2    Her Family History Is Significant For: Family History  Problem Relation Age of Onset  . Heart disease Mother   . Depression Mother   . Anxiety disorder Mother   . Diabetes Father   . Depression Father   . Anxiety disorder Father   . Stroke Maternal Grandmother   . Parkinsonism Paternal Grandmother   . Breast cancer Paternal Grandmother   . Bladder Cancer Paternal Grandfather     Her Social History Is Significant For: Social History   Socioeconomic History  . Marital status: Married    Spouse name: Not on file  . Number of children: 2  . Years of education: Masters  . Highest education level: Not on file  Occupational History  . Not on file  Social Needs  . Financial resource strain: Not on file  . Food insecurity:    Worry: Not on file    Inability: Not on file  . Transportation  needs:    Medical: Not on file    Non-medical: Not on file  Tobacco Use  . Smoking status: Former Smoker    Last attempt to quit: 10/21/2004    Years since quitting: 13.3  . Smokeless tobacco: Never Used  . Tobacco comment: quit 11 yrs ago  Substance and Sexual Activity  . Alcohol use: No    Alcohol/week: 0.0 oz  . Drug use: Not on file  . Sexual activity: Yes    Birth control/protection: Surgical  Lifestyle  . Physical activity:    Days per week: Not on file    Minutes per session: Not on file  . Stress: Not on file  Relationships  . Social connections:    Talks on phone: Not on file    Gets together: Not on file    Attends religious service: Not on file    Active member of club or organization: Not on file    Attends meetings of clubs or organizations: Not on file    Relationship status: Not on file  Other Topics Concern  . Not on file  Social History Narrative   2 caffeine drinks a day     Her Allergies Are:  Allergies  Allergen Reactions  . Sulfa Antibiotics   :   Her Current Medications Are:  Outpatient Encounter Medications as of 03/02/2018  Medication Sig  . amphetamine-dextroamphetamine (ADDERALL XR) 20 MG 24 hr capsule Take 1 capsule (20 mg total) by mouth daily.  Marland Kitchen amphetamine-dextroamphetamine (ADDERALL) 10 MG tablet Take 1 tablet (10 mg total) by mouth daily as needed.  . fluticasone (FLONASE) 50 MCG/ACT nasal spray Place 2 sprays into both nostrils daily as needed for allergies or rhinitis.  Marland Kitchen ibuprofen (ADVIL,MOTRIN) 200 MG tablet Take 200 mg by mouth as needed.  . modafinil (PROVIGIL) 100 MG tablet Take 200 mg (2 tablets) PO in the AM and 1 tablet after lunch no later than 3 PM.  . [DISCONTINUED] amphetamine-dextroamphetamine (ADDERALL XR) 20 MG 24 hr capsule Take 1 capsule (20 mg total) by mouth daily.  . [DISCONTINUED] amphetamine-dextroamphetamine (ADDERALL) 10 MG tablet Take 1 tablet (10 mg total) by mouth daily as needed.   No facility-administered  encounter medications on file as of 03/02/2018.   :  Review of Systems:  Out of a complete 14 point review of systems, all are reviewed and negative with the exception of these symptoms as listed below: Review of Systems  Neurological:       Pt presents today to discuss trying xyrem. Pt has noticed that she has had more headaches recently. Pt also noticed that she has a "eye-widening" behavior that her husband noticed. She is wondering if this is a tic, or if she has just taught herself to do this behavior to help her stay awake.    Objective:  Neurological Exam  Physical Exam Physical Examination:   Vitals:   03/02/18 0927  BP: 131/77  Pulse: 90    General Examination: The patient is a very pleasant 42 y.o. female in no acute distress. She appears well-developed and well-nourished and well groomed.   HEENT:Normocephalic, atraumatic, pupils are equal, round and reactive to light and accommodation. Contact lenses in place. Extraocular tracking is good without limitation to gaze excursion or nystagmus noted. Normal smooth pursuit is noted. Hearing is grossly intact. Face is symmetric with normal facial animation and normal facial sensation. Speech is clear with no dysarthria noted. There is no hypophonia. There is no lip, neck/head, jaw or voice tremor. Neck is supple with full range of passive and active motion. There are no carotid bruits on auscultation. Oropharynx exam reveals: no significant change noted. Mallampati is class II. Tongue protrudes centrally and palate elevates symmetrically.   Chest:Clear to auscultation without wheezing, rhonchi or crackles noted.  Heart:S1+S2+0, regular and normal without murmurs, rubs or gallops noted.   Abdomen:Soft, non-tender and non-distended with normal bowel sounds appreciated on auscultation.  Extremities:There is nopitting edema in the distal lower extremities bilaterally. Pedal pulses are intact.  Skin: Warm and dry without  trophic changes noted.  Musculoskeletal: exam reveals no obvious joint deformities, tenderness or joint swelling or erythema.   Neurologically:  Mental status: The patient is awake, alert and oriented in all 4 spheres. Herimmediate and remote memory, attention, language skills and fund of knowledge are appropriate. There is no evidence of aphasia, agnosia, apraxia or anomia. Speech is clear with normal prosody and enunciation. Thought process is linear. Mood is normaland affect is normal.  Cranial nerves II - XII are as described above under HEENT exam. In addition: shoulder shrug is normal with equal shoulder height noted. Motor exam: Normal bulk, strength and tone is noted. There is no tremor. Fine motor skills and coordination: grossly intact.  Cerebellar testing: No dysmetria or intention tremor.  Sensory exam: intact to light touch in the upper and lower extremities.  Gait, station and balance: Shestands with nodifficulty. No veering to one side is noted. No leaning to one side is noted. Posture is age-appropriate and stance is narrow based. Gait shows normalstride length and normalpace. No problems turning are noted.   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 She had sleep study testing, her daytime sleep study showed a mean sleep latency of 4.25 minutes with 2 REM onset naps. She does not have a telltale history of cataplexy at this time. She has been on generic Provigil which was increased gradually as well as a stimulant was added. She is now on Adderall long-acting and immediate release. Unfortunately, she has lost weight over time, compared to year ago she is 18 pounds less. I initially  started her on Nuvigil but she had side effects including headaches. Her symptoms are  suboptimally controlled on her current regimen of Provigil as well as a stimulant. She had an MRI brain in the interim which was unremarkable. Physical exam and neurological exam continued to be nonfocal. Today, we talked about his arm, its expectations, side effects, benefits, caution. All her questions were answered and she would like to get started on it. She understands that it will come from the centralized pharmacy. She also understands that she may be able to reduce her other medications down the road. She is advised about his mood related side effects and interaction with alcohol. She is willing to participate in random urine drug screening in the future. She is advised to call our office if she has not heard about her Xyrem prescription within the next week. I would like to see her back in 2-3 months, sooner if needed, she has been good about contacting our office through my chart emailing. I answered all her questions today and she was in agreement with the plan. I spent 30 minutes in total face-to-face time with the patient, more than 50% of which was spent in counseling and coordination of care, reviewing test results, reviewing medication and discussing or reviewing the diagnosis of Narcolepsy, its prognosis and treatment options. Pertinent laboratory and imaging test results that were available during this visit with the patient were reviewed by me and considered in my medical decision making (see chart for details).

## 2018-03-05 ENCOUNTER — Encounter: Payer: Self-pay | Admitting: Podiatry

## 2018-03-05 ENCOUNTER — Ambulatory Visit: Payer: BLUE CROSS/BLUE SHIELD | Admitting: Podiatry

## 2018-03-05 ENCOUNTER — Ambulatory Visit (INDEPENDENT_AMBULATORY_CARE_PROVIDER_SITE_OTHER): Payer: BLUE CROSS/BLUE SHIELD

## 2018-03-05 ENCOUNTER — Other Ambulatory Visit: Payer: Self-pay | Admitting: Podiatry

## 2018-03-05 VITALS — BP 125/74 | HR 94 | Resp 16

## 2018-03-05 DIAGNOSIS — M79671 Pain in right foot: Secondary | ICD-10-CM

## 2018-03-05 DIAGNOSIS — D3610 Benign neoplasm of peripheral nerves and autonomic nervous system, unspecified: Secondary | ICD-10-CM | POA: Diagnosis not present

## 2018-03-05 DIAGNOSIS — M722 Plantar fascial fibromatosis: Secondary | ICD-10-CM

## 2018-03-05 NOTE — Progress Notes (Signed)
   Subjective:    Patient ID: Elizabeth [name], female    DOB: Aug 24, 1976, 42 y.o.   MRN: [id number]  HPI    Review of Systems  All other systems reviewed and are negative.      Objective:   Physical Exam        Assessment & Plan:

## 2018-03-05 NOTE — Patient Instructions (Signed)
Morton Neuralgia Morton neuralgia is a type of foot pain in the area closest to your toes. This area is sometimes called the ball of your foot. Morton neuralgia occurs when a branch of a nerve in your foot (digital nerve) becomes compressed. When this happens over a long period of time, the nerve can thicken (neuroma) and cause pain. This usually occurs between the third and fourth toe. Morton neuralgia can come and go but may get worse over time. What are the causes? Your digital nerve can become compressed and stretched at a point where it passes under a thick band of tissue that connects your toes (intermetatarsal ligament). Morton neuralgia can be caused by mild repetitive damage in this area. This type of damage can result from:  Activities such as running or jumping.  Wearing shoes that are too tight.  What increases the risk? You may be at risk for Morton neuralgia if you:  Are female.  Wear high heels.  Wear shoes that are narrow or tight.  Participate in activities that stretch your toes. These include: ? Running. ? Ballet. ? Long-distance walking.  What are the signs or symptoms? The first symptom of Morton neuralgia is pain that spreads from the ball of your foot to your toes. It may feel like you are walking on a marble. Pain usually gets worse with walking and goes away at night. Other symptoms may include numbness and cramping of your toes. How is this diagnosed? Your health care provider will do a physical exam. When doing the exam, your health care provider may:  Squeeze your foot just behind your toe.  Ask you to move your toes to check for pain.  You may also have tests on your foot to confirm the diagnosis. These may include:  An X-ray.  An MRI.  How is this treated? Treatment for Morton neuralgia may be as simple as changing the kind of shoes you wear. Other treatments may include:  Wearing a supportive pad (orthosis) under the front of your foot. This  lifts your toe bones and takes pressure off the nerve.  Getting injections of numbing medicine and anti-inflammatory medicine (steroid) in the nerve.  Having surgery to remove part of the thickened nerve.  Follow these instructions at home:  Take medicine only as directed by your health care provider.  Wear soft-soled shoes with a wide toe area.  Stop activities that may be causing pain.  Elevate your foot when resting.  Massage your foot.  Apply ice to the injured area: ? Put ice in a plastic bag. ? Place a towel between your skin and the bag. ? Leave the ice on for 20 minutes, 2-3 times a day.  Keep all follow-up visits as directed by your health care provider. This is important. Contact a health care provider if:  Home care instructions are not helping you get better.  Your symptoms change or get worse. This information is not intended to replace advice given to you by your health care provider. Make sure you discuss any questions you have with your health care provider. Document Released: 01/13/2001 Document Revised: 03/14/2016 Document Reviewed: 12/08/2013 Elsevier Interactive Patient Education  2018 [employer]  

## 2018-03-06 NOTE — Progress Notes (Signed)
Subjective:   Patient ID: Elizabeth [name], female   DOB: 42 y.o.   MRN: [id number]   HPI Patient presents that she is had a long-term history of pain in her right foot and it seems to gradually be getting worse.  She states that the burning it seems to come and go and at times is worse on a barefoot hardwood type of floor.  Patient does not smoke likes to be active and has trouble wearing tighter shoes   Review of Systems  All other systems reviewed and are negative.       Objective:  Physical Exam  Constitutional: She appears well-developed and well-nourished.  Cardiovascular: Intact distal pulses.  Pulmonary/Chest: Effort normal.  Musculoskeletal: Normal range of motion.  Neurological: She is alert.  Skin: Skin is warm.  Nursing note and vitals reviewed.   Neurovascular status found to be intact with muscle strength adequate range of motion within normal limits.  Patient does have positive Biagio Borg sign right with palpable mass noted and shooting pains occurring into the third and fourth digits of the right foot with pain upon percussion.  I did not note significant pain in the metatarsal phalangeal joint right and left foot at the current time and patient did have good digital perfusion and is well oriented x3     Assessment:  Probability for neuroma symptomatology right with slight possibility of inflammatory capsulitis metatarsalgia but it appears the pain is more related to nerve compression     Plan:  H&P x-ray reviewed condition discussed.  At this point will get a try injection treatment with steroid and this will be both diagnostic and therapeutic but I did discuss with her given her long-term history of a palpable mass that excision may be necessary.  I did a sterile prep of the right third interspace injected the interspace with a combination of Kenalog Xylocaine and Marcaine which was tolerated well and patient will be seen back for [place] to recheck again in 1 week to discuss  the results  X-rays were negative for signs of bony injury or other bony pathology

## 2018-03-10 ENCOUNTER — Other Ambulatory Visit: Payer: Self-pay | Admitting: Family Medicine

## 2018-03-10 DIAGNOSIS — J0100 Acute maxillary sinusitis, unspecified: Secondary | ICD-10-CM

## 2018-03-11 NOTE — Progress Notes (Signed)
I called xyrem REMS program. Pt has been enrolled, pt's insurance has approved xyrem, no PA is needed. The REMS program has been in contact with pt to discuss financial information and shipping.

## 2018-03-12 ENCOUNTER — Ambulatory Visit: Payer: BLUE CROSS/BLUE SHIELD | Admitting: Podiatry

## 2018-03-12 ENCOUNTER — Encounter: Payer: Self-pay | Admitting: Podiatry

## 2018-03-12 DIAGNOSIS — D3610 Benign neoplasm of peripheral nerves and autonomic nervous system, unspecified: Secondary | ICD-10-CM | POA: Diagnosis not present

## 2018-03-12 NOTE — Patient Instructions (Signed)
Pre-Operative Instructions  Congratulations, you have decided to take an important step towards improving your quality of life.  You can be assured that the doctors and staff at [facility] will be with you every step of the way.  Here are some important things you should know:  1. Plan to be at the surgery center/hospital at least 1 (one) hour prior to your scheduled time, unless otherwise directed by the surgical center/hospital staff.  You must have a responsible adult accompany you, remain during the surgery and drive you home.  Make sure you have directions to the surgical center/hospital to ensure you arrive on time. 2. If you are having surgery at [facility] or [facility], you will need a copy of your medical history and physical form from your family physician within one month prior to the date of surgery. We will give you a form for your primary physician to complete.  3. We make every effort to accommodate the date you request for surgery.  However, there are times where surgery dates or times have to be moved.  We will contact you as soon as possible if a change in schedule is required.   4. No aspirin/ibuprofen for one week before surgery.  If you are on aspirin, any non-steroidal anti-inflammatory medications (Mobic, Aleve, Ibuprofen) should not be taken seven (7) days prior to your surgery.  You make take Tylenol for pain prior to surgery.  5. Medications - If you are taking daily heart and blood pressure medications, seizure, reflux, allergy, asthma, anxiety, pain or diabetes medications, make sure you notify the surgery center/hospital before the day of surgery so they can tell you which medications you should take or avoid the day of surgery. 6. No food or drink after midnight the night before surgery unless directed otherwise by surgical center/hospital staff. 7. No alcoholic beverages 24-hours prior to surgery.  No smoking 24-hours prior or 24-hours after  surgery. 8. Wear loose pants or shorts. They should be loose enough to fit over bandages, boots, and casts. 9. Don't wear slip-on shoes. Sneakers are preferred. 10. Bring your boot with you to the surgery center/hospital.  Also bring crutches or a walker if your physician has prescribed it for you.  If you do not have this equipment, it will be provided for you after surgery. 11. If you have not been contacted by the surgery center/hospital by the day before your surgery, call to confirm the date and time of your surgery. 12. Leave-time from work may vary depending on the type of surgery you have.  Appropriate arrangements should be made prior to surgery with your employer. 13. Prescriptions will be provided immediately following surgery by your doctor.  Fill these as soon as possible after surgery and take the medication as directed. Pain medications will not be refilled on weekends and must be approved by the doctor. 14. Remove nail polish on the operative foot and avoid getting pedicures prior to surgery. 15. Wash the night before surgery.  The night before surgery wash the foot and leg well with water and the antibacterial soap provided. Be sure to pay special attention to beneath the toenails and in between the toes.  Wash for at least three (3) minutes. Rinse thoroughly with water and dry well with a towel.  Perform this wash unless told not to do so by your physician.  Enclosed: 1 Ice pack (please put in freezer the night before surgery)   1 Hibiclens skin cleaner     Pre-op instructions  If you have any questions regarding the instructions, please do not hesitate to call our office.  [place]: [address], [place], [place] [address] -- [phone]  [place]: [address] [place], [place] [address] -- [phone]  [place]: [address]  [place], [place] [address] -- [phone]  [place]: [address], [address], [place], [place] [address] -- [phone]  Website:  [email or web address] 

## 2018-03-12 NOTE — Progress Notes (Signed)
Subjective:   Patient ID: Elizabeth [name], female   DOB: 42 y.o.   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  I explained procedure and risk and patient wants this done and I explained the procedure to patient with risk.  I went over alternative treatments complications and after extensive review she signed consent form is scheduled for outpatient surgery and is encouraged to call us with any questions prior to procedure.  I understand she will have some numbness between her toes and told recovery.  Can take upwards to 6 months

## 2018-03-23 ENCOUNTER — Other Ambulatory Visit: Payer: Self-pay | Admitting: Adult Health

## 2018-03-23 ENCOUNTER — Telehealth: Payer: Self-pay | Admitting: *Deleted

## 2018-03-23 NOTE — Telephone Encounter (Signed)
"  It's totally fine for you to leave a message on my phone.  I need to schedule a surgery but my husband wanted to know about how much it's going to be first.  Does the surgical center work with you, like a payment plan or so much a month?  So I need to know that before I can schedule surgery.  So if you would, give me a call back.  I'm going to be getting surgery done on a Neuroma."

## 2018-03-24 ENCOUNTER — Telehealth: Payer: Self-pay | Admitting: *Deleted

## 2018-03-24 NOTE — Telephone Encounter (Signed)
Modafinil refill Rx [facility]ed to [facility] [place].

## 2018-03-24 NOTE — Telephone Encounter (Signed)
[place] drug registry checked

## 2018-03-27 ENCOUNTER — Telehealth: Payer: Self-pay | Admitting: Podiatry

## 2018-03-27 NOTE — Telephone Encounter (Addendum)
"  I am calling about my scheduling my surgery."  Yes, he can do it on June 18.  "Can you tell me who I can call and speak to at the surgery center that can let me know my cost out of pocket?"  You can call the surgical center.  Their phone number is located on the back of the brochure that we gave you.  As far as an estimate for the professional fee, you will need to speak to Jocelyn Lamer in our insurance department.  "Can you give me her phone number?"  Her phone number is [phone].  She is not here now.  We close at 2 pm on Fridays.  (Neurectomy 3rd right)

## 2018-03-27 NOTE — Telephone Encounter (Signed)
Elizabeth [name] Is calling to be scheduled for surgery. She said she is interested in having surgery around 6/18. She can be reached at [phone]. Patient wants to speak with you about some other things, including how much she would have to come out of pocket with.

## 2018-04-01 NOTE — Telephone Encounter (Signed)
Patient has been scheduled for surgery on April 07, 2018 with Dr. Paulla Dolly.

## 2018-04-07 ENCOUNTER — Encounter: Payer: Self-pay | Admitting: Podiatry

## 2018-04-07 DIAGNOSIS — M25571 Pain in right ankle and joints of right foot: Secondary | ICD-10-CM | POA: Diagnosis not present

## 2018-04-07 DIAGNOSIS — G5781 Other specified mononeuropathies of right lower limb: Secondary | ICD-10-CM | POA: Diagnosis not present

## 2018-04-07 DIAGNOSIS — G5761 Lesion of plantar nerve, right lower limb: Secondary | ICD-10-CM | POA: Diagnosis not present

## 2018-04-08 ENCOUNTER — Encounter: Payer: Self-pay | Admitting: Podiatry

## 2018-04-09 ENCOUNTER — Encounter: Payer: Self-pay | Admitting: Neurology

## 2018-04-09 ENCOUNTER — Other Ambulatory Visit: Payer: Self-pay

## 2018-04-09 MED ORDER — AMPHETAMINE-DEXTROAMPHETAMINE 10 MG PO TABS: 10.0000 mg | ORAL_TABLET | Freq: Every day | ORAL | 0 refills | Status: DC | PRN

## 2018-04-09 MED ORDER — AMPHETAMINE-DEXTROAMPHET ER 20 MG PO CP24: 20.0000 mg | ORAL_CAPSULE | Freq: Every day | ORAL | 0 refills | Status: DC

## 2018-04-10 ENCOUNTER — Ambulatory Visit (INDEPENDENT_AMBULATORY_CARE_PROVIDER_SITE_OTHER): Payer: Self-pay

## 2018-04-10 ENCOUNTER — Ambulatory Visit (INDEPENDENT_AMBULATORY_CARE_PROVIDER_SITE_OTHER): Payer: BLUE CROSS/BLUE SHIELD

## 2018-04-10 ENCOUNTER — Telehealth: Payer: Self-pay | Admitting: Podiatry

## 2018-04-10 DIAGNOSIS — D3610 Benign neoplasm of peripheral nerves and autonomic nervous system, unspecified: Secondary | ICD-10-CM

## 2018-04-10 DIAGNOSIS — G5701 Lesion of sciatic nerve, right lower limb: Secondary | ICD-10-CM | POA: Diagnosis not present

## 2018-04-10 NOTE — Progress Notes (Signed)
Patient presents s/p neurectomy right foot DOS 6.18.19 stating that she fell over her dog's bone and felt a popping sensation in her foot followed by pain   Post operative foot appeared to be healing well, incision remained intact, no erythema, or gapping of wound. Xrays reviewed by Dr Paulla Dolly and were determined to be normal   She is to follow up at regularly schedule appointment or sooner if problems arise

## 2018-04-10 NOTE — Telephone Encounter (Signed)
Pt. Tripped and just wanted to make you aware of it. Not having any pain, just not sure what to do.

## 2018-04-10 NOTE — Telephone Encounter (Signed)
I spoke with pt she states she fell on a dog bone and sat right down on he surgery foot and thinks she heard a pop and is having some pain. I asked pt if she had any bleeding through the dressing, and pt denied. I told pt I felt better if she was evaluated today and transferred to the scheduler to get on the nurses schedule.

## 2018-04-14 ENCOUNTER — Encounter: Payer: Self-pay | Admitting: Podiatry

## 2018-04-15 ENCOUNTER — Ambulatory Visit (INDEPENDENT_AMBULATORY_CARE_PROVIDER_SITE_OTHER): Payer: BLUE CROSS/BLUE SHIELD | Admitting: Podiatry

## 2018-04-15 DIAGNOSIS — G5701 Lesion of sciatic nerve, right lower limb: Secondary | ICD-10-CM | POA: Diagnosis not present

## 2018-04-15 DIAGNOSIS — D3610 Benign neoplasm of peripheral nerves and autonomic nervous system, unspecified: Secondary | ICD-10-CM

## 2018-04-16 NOTE — Progress Notes (Signed)
Subjective:   Patient ID: Elizabeth [name], female   DOB: 42 y.o.   MRN: [id number]   HPI Patient states doing really well with wound edges well coapted and minimal discomfort right foot   ROS      Objective:  Physical Exam  Neurovascular status intact negative Homans sign noted with doing well post neurectomy third interspace right     Assessment:  Very satisfied with wound edges looking excellent and patient's right foot no longer having the symptoms that she had preoperatively     Plan:  Discussed continued immobilization compression and dispensed ankle compression stocking along with Ace wrap and elevation and will gradually increase activity over the next 6 to 8 weeks and will be seen back as needed

## 2018-04-21 ENCOUNTER — Encounter: Payer: Self-pay | Admitting: Podiatry

## 2018-04-22 ENCOUNTER — Other Ambulatory Visit: Payer: Self-pay | Admitting: Adult Health

## 2018-04-22 ENCOUNTER — Encounter: Payer: Self-pay | Admitting: Neurology

## 2018-04-22 NOTE — Telephone Encounter (Signed)
[place] [facility] registry

## 2018-04-26 ENCOUNTER — Encounter: Payer: Self-pay | Admitting: Family Medicine

## 2018-04-30 ENCOUNTER — Encounter: Payer: Self-pay | Admitting: Family Medicine

## 2018-04-30 ENCOUNTER — Ambulatory Visit: Payer: BLUE CROSS/BLUE SHIELD | Admitting: Family Medicine

## 2018-04-30 VITALS — BP 117/68 | HR 95 | Temp 98.1°F | Ht 65.0 in | Wt 119.4 lb

## 2018-04-30 DIAGNOSIS — M2550 Pain in unspecified joint: Secondary | ICD-10-CM | POA: Diagnosis not present

## 2018-04-30 MED ORDER — MELOXICAM 15 MG PO TABS
15.0000 mg | ORAL_TABLET | Freq: Every day | ORAL | 1 refills | Status: DC
Start: 2018-04-30 — End: 2018-06-30

## 2018-04-30 NOTE — Progress Notes (Signed)
Chief Complaint  Patient presents with  . Arthritis    Referral to rheumatologist needed for arthritis. Pt stats that she has pain and weakness in thighs and legs. Also having pain in back and neck.    Subjective: Patient is a 42 y.o. female here for joint pain.  Saw rheum in 2012 and would like to see again. Not dx'd with AI etiology.  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  There are no signs of degenerative arthropathy.  She continues to have pain in her hips, knees, shoulders, neck, and lower back.  She does not do any stretches or exercises.  Anti-inflammatories are helpful.  No neurologic signs or symptoms.  She denies rashes or fevers.   ROS: MSK: As noted above Neuro: No numbness or tingling  Past Medical History:  Diagnosis Date  . Allergy   . Anemia   . Anxiety state 02/15/2016  . Arthritis   . Depression     Objective: BP 117/68 (BP Location: Left Arm, Patient Position: Sitting, Cuff Size: Normal)   Pulse 95   Temp 98.1 F (36.7 C) (Oral)   Ht 5' 5" (1.651 m)   Wt 119 lb 6.4 oz (54.2 kg)   SpO2 98%   BMI 19.87 kg/m  General: Awake, appears stated age HEENT: MMM, EOMi Heart: RRR Lungs: CTAB, no rales, wheezes or rhonchi. No accessory muscle use Neuro: DTRs equal and symmetric, no cerebellar signs MSK: TTP over lumbar and cervical paraspinal musculature, negative straight leg, poor range of motion of hamstrings bilaterally, 5/5 strength throughout Skin: I do not appreciate any rashes postauricular or on face Psych: Age appropriate judgment and insight, normal affect and mood  Assessment and Plan: Arthralgia, unspecified joint - Plan: Ambulatory referral to Rheumatology, ANA,IFA RA Diag Pnl w/rflx Tit/Patn, Rheumatoid Factor, Aldolase, Cyclic citrul peptide antibody, IgG, HLA-B27 Antigen, Sedimentation rate  Orders as above.  We will perform autoimmune work-up and refer to rheumatology per  patient request.  I do believe she would benefit from Mobic.  Home stretches and exercises given for both cervical spine and lumbar spine.  Ice, heat, Tylenol. Cymbalta and PT could be considered in future if no help. Follow-up with Dr. Lorelei Pont as needed. The patient voiced understanding and agreement to the plan.  [place], DO 04/30/18  5:07 PM

## 2018-04-30 NOTE — Patient Instructions (Addendum)
Ice/cold pack over area for 10-15 min twice daily.  Heat (pad or rice pillow in microwave) over affected area, 10-15 minutes twice daily.   OK to take Tylenol 1000 mg (2 extra strength tabs) or 975 mg (3 regular strength tabs) every 6 hours as needed.  If you do not hear anything about your referral in the next 1-2 weeks, call our office and ask for an update.  EXERCISES RANGE OF MOTION (ROM) AND STRETCHING EXERCISES  These exercises may help you when beginning to rehabilitate your issue. In order to successfully resolve your symptoms, you must improve your posture. These exercises are designed to help reduce the forward-head and rounded-shoulder posture which contributes to this condition. Your symptoms may resolve with or without further involvement from your physician, physical therapist or athletic trainer. While completing these exercises, remember:   Restoring tissue flexibility helps normal motion to return to the joints. This allows healthier, less painful movement and activity.  An effective stretch should be held for at least 20 seconds, although you may need to begin with shorter hold times for comfort.  A stretch should never be painful. You should only feel a gentle lengthening or release in the stretched tissue.  Do not do any stretch or exercise that you cannot tolerate.  STRETCH- Axial Extensors  Lie on your back on the floor. You may bend your knees for comfort. Place a rolled-up hand towel or dish towel, about 2 inches in diameter, under the part of your head that makes contact with the floor.  Gently tuck your chin, as if trying to make a "double chin," until you feel a gentle stretch at the base of your head.  Hold 15-20 seconds. Repeat 2-3 times. Complete this exercise 1 time per day.   STRETCH - Axial Extension   Stand or sit on a firm surface. Assume a good posture: chest up, shoulders drawn back, abdominal muscles slightly tense, knees unlocked (if standing) and  feet hip width apart.  Slowly retract your chin so your head slides back and your chin slightly lowers. Continue to look straight ahead.  You should feel a gentle stretch in the back of your head. Be certain not to feel an aggressive stretch since this can cause headaches later.  Hold for 15-20 seconds. Repeat 2-3 times. Complete this exercise 1 time per day.  STRETCH - Cervical Side Bend   Stand or sit on a firm surface. Assume a good posture: chest up, shoulders drawn back, abdominal muscles slightly tense, knees unlocked (if standing) and feet hip width apart.  Without letting your nose or shoulders move, slowly tip your right / left ear to your shoulder until your feel a gentle stretch in the muscles on the opposite side of your neck.  Hold 15-20 seconds. Repeat 2-3 times. Complete this exercise 1-2 times per day.  STRETCH - Cervical Rotators   Stand or sit on a firm surface. Assume a good posture: chest up, shoulders drawn back, abdominal muscles slightly tense, knees unlocked (if standing) and feet hip width apart.  Keeping your eyes level with the ground, slowly turn your head until you feel a gentle stretch along the back and opposite side of your neck.  Hold 15-20 seconds. Repeat 2-3 times. Complete this exercise 1-2 times per day.  RANGE OF MOTION - Neck Circles   Stand or sit on a firm surface. Assume a good posture: chest up, shoulders drawn back, abdominal muscles slightly tense, knees unlocked (if standing) and feet  hip width apart.  Gently roll your head down and around from the back of one shoulder to the back of the other. The motion should never be forced or painful.  Repeat the motion 10-20 times, or until you feel the neck muscles relax and loosen. Repeat 2-3 times. Complete the exercise 1-2 times per day. STRENGTHENING EXERCISES - Cervical Strain and Sprain These exercises may help you when beginning to rehabilitate your injury. They may resolve your symptoms  with or without further involvement from your physician, physical therapist, or athletic trainer. While completing these exercises, remember:   Muscles can gain both the endurance and the strength needed for everyday activities through controlled exercises.  Complete these exercises as instructed by your physician, physical therapist, or athletic trainer. Progress the resistance and repetitions only as guided.  You may experience muscle soreness or fatigue, but the pain or discomfort you are trying to eliminate should never worsen during these exercises. If this pain does worsen, stop and make certain you are following the directions exactly. If the pain is still present after adjustments, discontinue the exercise until you can discuss the trouble with your clinician.  STRENGTH - Cervical Flexors, Isometric  Face a wall, standing about 6 inches away. Place a small pillow, a ball about 6-8 inches in diameter, or a folded towel between your forehead and the wall.  Slightly tuck your chin and gently push your forehead into the soft object. Push only with mild to moderate intensity, building up tension gradually. Keep your jaw and forehead relaxed.  Hold 10 to 20 seconds. Keep your breathing relaxed.  Release the tension slowly. Relax your neck muscles completely before you start the next repetition. Repeat 2-3 times. Complete this exercise 1 time per day.  STRENGTH- Cervical Lateral Flexors, Isometric   Stand about 6 inches away from a wall. Place a small pillow, a ball about 6-8 inches in diameter, or a folded towel between the side of your head and the wall.  Slightly tuck your chin and gently tilt your head into the soft object. Push only with mild to moderate intensity, building up tension gradually. Keep your jaw and forehead relaxed.  Hold 10 to 20 seconds. Keep your breathing relaxed.  Release the tension slowly. Relax your neck muscles completely before you start the next  repetition. Repeat 2-3 times. Complete this exercise 1 time per day.  STRENGTH - Cervical Extensors, Isometric   Stand about 6 inches away from a wall. Place a small pillow, a ball about 6-8 inches in diameter, or a folded towel between the back of your head and the wall.  Slightly tuck your chin and gently tilt your head back into the soft object. Push only with mild to moderate intensity, building up tension gradually. Keep your jaw and forehead relaxed.  Hold 10 to 20 seconds. Keep your breathing relaxed.  Release the tension slowly. Relax your neck muscles completely before you start the next repetition. Repeat 2-3 times. Complete this exercise 1 time per day.  POSTURE AND BODY MECHANICS CONSIDERATIONS Keeping correct posture when sitting, standing or completing your activities will reduce the stress put on different body tissues, allowing injured tissues a chance to heal and limiting painful experiences. The following are general guidelines for improved posture. Your physician or physical therapist will provide you with any instructions specific to your needs. While reading these guidelines, remember:  The exercises prescribed by your provider will help you have the flexibility and strength to maintain correct postures.  The correct posture provides the optimal environment for your joints to work. All of your joints have less wear and tear when properly supported by a spine with good posture. This means you will experience a healthier, less painful body.  Correct posture must be practiced with all of your activities, especially prolonged sitting and standing. Correct posture is as important when doing repetitive low-stress activities (typing) as it is when doing a single heavy-load activity (lifting).  PROLONGED STANDING WHILE SLIGHTLY LEANING FORWARD When completing a task that requires you to lean forward while standing in one place for a long time, place either foot up on a stationary  2- to 4-inch high object to help maintain the best posture. When both feet are on the ground, the low back tends to lose its slight inward curve. If this curve flattens (or becomes too large), then the back and your other joints will experience too much stress, fatigue more quickly, and can cause pain.   RESTING POSITIONS Consider which positions are most painful for you when choosing a resting position. If you have pain with flexion-based activities (sitting, bending, stooping, squatting), choose a position that allows you to rest in a less flexed posture. You would want to avoid curling into a fetal position on your side. If your pain worsens with extension-based activities (prolonged standing, working overhead), avoid resting in an extended position such as sleeping on your stomach. Most people will find more comfort when they rest with their spine in a more neutral position, neither too rounded nor too arched. Lying on a non-sagging bed on your side with a pillow between your knees, or on your back with a pillow under your knees will often provide some relief. Keep in mind, being in any one position for a prolonged period of time, no matter how correct your posture, can still lead to stiffness.  WALKING Walk with an upright posture. Your ears, shoulders, and hips should all line up. OFFICE WORK When working at a desk, create an environment that supports good, upright posture. Without extra support, muscles fatigue and lead to excessive strain on joints and other tissues.  CHAIR:  A chair should be able to slide under your desk when your back makes contact with the back of the chair. This allows you to work closely.  The chair's height should allow your eyes to be level with the upper part of your monitor and your hands to be slightly lower than your elbows.  Body position: ? Your feet should make contact with the floor. If this is not possible, use a foot rest. ? Keep your ears over your  shoulders. This will reduce stress on your neck and low back.   EXERCISES  RANGE OF MOTION (ROM) AND STRETCHING EXERCISES - Low Back Pain Most people with lower back pain will find that their symptoms get worse with excessive bending forward (flexion) or arching at the lower back (extension). The exercises that will help resolve your symptoms will focus on the opposite motion.  If you have pain, numbness or tingling which travels down into your buttocks, leg or foot, the goal of the therapy is for these symptoms to move closer to your back and eventually resolve. Sometimes, these leg symptoms will get better, but your lower back pain may worsen. This is often an indication of progress in your rehabilitation. Be very alert to any changes in your symptoms and the activities in which you participated in the 24 hours prior to the change.  Sharing this information with your caregiver will allow him or her to most efficiently treat your condition. These exercises may help you when beginning to rehabilitate your injury. Your symptoms may resolve with or without further involvement from your physician, physical therapist or athletic trainer. While completing these exercises, remember:   Restoring tissue flexibility helps normal motion to return to the joints. This allows healthier, less painful movement and activity.  An effective stretch should be held for at least 30 seconds.  A stretch should never be painful. You should only feel a gentle lengthening or release in the stretched tissue. FLEXION RANGE OF MOTION AND STRETCHING EXERCISES:  STRETCH - Flexion, Single Knee to Chest   Lie on a firm bed or floor with both legs extended in front of you.  Keeping one leg in contact with the floor, bring your opposite knee to your chest. Hold your leg in place by either grabbing behind your thigh or at your knee.  Pull until you feel a gentle stretch in your low back. Hold 30 seconds.  Slowly release your  grasp and repeat the exercise with the opposite side. Repeat 2 times. Complete this exercise 3 times per week.   STRETCH - Flexion, Double Knee to Chest  Lie on a firm bed or floor with both legs extended in front of you.  Keeping one leg in contact with the floor, bring your opposite knee to your chest.  Tense your stomach muscles to support your back and then lift your other knee to your chest. Hold your legs in place by either grabbing behind your thighs or at your knees.  Pull both knees toward your chest until you feel a gentle stretch in your low back. Hold 30 seconds.  Tense your stomach muscles and slowly return one leg at a time to the floor. Repeat 2 times. Complete this exercise 3 times per week.   STRETCH - Low Trunk Rotation  Lie on a firm bed or floor. Keeping your legs in front of you, bend your knees so they are both pointed toward the ceiling and your feet are flat on the floor.  Extend your arms out to the side. This will stabilize your upper body by keeping your shoulders in contact with the floor.  Gently and slowly drop both knees together to one side until you feel a gentle stretch in your low back. Hold for 30 seconds.  Tense your stomach muscles to support your lower back as you bring your knees back to the starting position. Repeat the exercise to the other side. Repeat 2 times. Complete this exercise at least 3 times per week.   EXTENSION RANGE OF MOTION AND FLEXIBILITY EXERCISES:  STRETCH - Extension, Prone on Elbows   Lie on your stomach on the floor, a bed will be too soft. Place your palms about shoulder width apart and at the height of your head.  Place your elbows under your shoulders. If this is too painful, stack pillows under your chest.  Allow your body to relax so that your hips drop lower and make contact more completely with the floor.  Hold this position for 30 seconds.  Slowly return to lying flat on the floor. Repeat 2 times. Complete  this exercise 3 times per week.   RANGE OF MOTION - Extension, Prone Press Ups  Lie on your stomach on the floor, a bed will be too soft. Place your palms about shoulder width apart and at the height of your head.  Keeping your back as relaxed as possible, slowly straighten your elbows while keeping your hips on the floor. You may adjust the placement of your hands to maximize your comfort. As you gain motion, your hands will come more underneath your shoulders.  Hold this position 30 seconds.  Slowly return to lying flat on the floor. Repeat 2 times. Complete this exercise 3 times per week.   RANGE OF MOTION- Quadruped, Neutral Spine   Assume a hands and knees position on a firm surface. Keep your hands under your shoulders and your knees under your hips. You may place padding under your knees for comfort.  Drop your head and point your tailbone toward the ground below you. This will round out your lower back like an angry cat. Hold this position for 30 seconds.  Slowly lift your head and release your tail bone so that your back sags into a large arch, like an old horse.  Hold this position for 30 seconds.  Repeat this until you feel limber in your low back.  Now, find your "sweet spot." This will be the most comfortable position somewhere between the two previous positions. This is your neutral spine. Once you have found this position, tense your stomach muscles to support your low back.  Hold this position for 30 seconds. Repeat 2 times. Complete this exercise 3 times per week.   STRENGTHENING EXERCISES - Low Back Sprain These exercises may help you when beginning to rehabilitate your injury. These exercises should be done near your "sweet spot." This is the neutral, low-back arch, somewhere between fully rounded and fully arched, that is your least painful position. When performed in this safe range of motion, these exercises can be used for people who have either a flexion or  extension based injury. These exercises may resolve your symptoms with or without further involvement from your physician, physical therapist or athletic trainer. While completing these exercises, remember:   Muscles can gain both the endurance and the strength needed for everyday activities through controlled exercises.  Complete these exercises as instructed by your physician, physical therapist or athletic trainer. Increase the resistance and repetitions only as guided.  You may experience muscle soreness or fatigue, but the pain or discomfort you are trying to eliminate should never worsen during these exercises. If this pain does worsen, stop and make certain you are following the directions exactly. If the pain is still present after adjustments, discontinue the exercise until you can discuss the trouble with your caregiver.  STRENGTHENING - Deep Abdominals, Pelvic Tilt   Lie on a firm bed or floor. Keeping your legs in front of you, bend your knees so they are both pointed toward the ceiling and your feet are flat on the floor.  Tense your lower abdominal muscles to press your low back into the floor. This motion will rotate your pelvis so that your tail bone is scooping upwards rather than pointing at your feet or into the floor. With a gentle tension and even breathing, hold this position for 3 seconds. Repeat 2 times. Complete this exercise 3 times per week.   STRENGTHENING - Abdominals, Crunches   Lie on a firm bed or floor. Keeping your legs in front of you, bend your knees so they are both pointed toward the ceiling and your feet are flat on the floor. Cross your arms over your chest.  Slightly tip your chin down without bending your neck.  Tense your abdominals and slowly lift your trunk high  enough to just clear your shoulder blades. Lifting higher can put excessive stress on the lower back and does not further strengthen your abdominal muscles.  Control your return to the  starting position. Repeat 2 times. Complete this exercise 3 times per week.   STRENGTHENING - Quadruped, Opposite UE/LE Lift   Assume a hands and knees position on a firm surface. Keep your hands under your shoulders and your knees under your hips. You may place padding under your knees for comfort.  Find your neutral spine and gently tense your abdominal muscles so that you can maintain this position. Your shoulders and hips should form a rectangle that is parallel with the floor and is not twisted.  Keeping your trunk steady, lift your right hand no higher than your shoulder and then your left leg no higher than your hip. Make sure you are not holding your breath. Hold this position for 30 seconds.  Continuing to keep your abdominal muscles tense and your back steady, slowly return to your starting position. Repeat with the opposite arm and leg. Repeat 2 times. Complete this exercise 3 times per week.   STRENGTHENING - Abdominals and Quadriceps, Straight Leg Raise   Lie on a firm bed or floor with both legs extended in front of you.  Keeping one leg in contact with the floor, bend the other knee so that your foot can rest flat on the floor.  Find your neutral spine, and tense your abdominal muscles to maintain your spinal position throughout the exercise.  Slowly lift your straight leg off the floor about 6 inches for a count of 3, making sure to not hold your breath.  Still keeping your neutral spine, slowly lower your leg all the way to the floor. Repeat this exercise with each leg 2 times. Complete this exercise 3 times per week.  POSTURE AND BODY MECHANICS CONSIDERATIONS - Low Back Sprain Keeping correct posture when sitting, standing or completing your activities will reduce the stress put on different body tissues, allowing injured tissues a chance to heal and limiting painful experiences. The following are general guidelines for improved posture.  While reading these guidelines,  remember:  The exercises prescribed by your provider will help you have the flexibility and strength to maintain correct postures.  The correct posture provides the best environment for your joints to work. All of your joints have less wear and tear when properly supported by a spine with good posture. This means you will experience a healthier, less painful body.  Correct posture must be practiced with all of your activities, especially prolonged sitting and standing. Correct posture is as important when doing repetitive low-stress activities (typing) as it is when doing a single heavy-load activity (lifting).  RESTING POSITIONS Consider which positions are most painful for you when choosing a resting position. If you have pain with flexion-based activities (sitting, bending, stooping, squatting), choose a position that allows you to rest in a less flexed posture. You would want to avoid curling into a fetal position on your side. If your pain worsens with extension-based activities (prolonged standing, working overhead), avoid resting in an extended position such as sleeping on your stomach. Most people will find more comfort when they rest with their spine in a more neutral position, neither too rounded nor too arched. Lying on a non-sagging bed on your side with a pillow between your knees, or on your back with a pillow under your knees will often provide some relief. Keep in mind,  being in any one position for a prolonged period of time, no matter how correct your posture, can still lead to stiffness.  PROPER SITTING POSTURE In order to minimize stress and discomfort on your spine, you must sit with correct posture. Sitting with good posture should be effortless for a healthy body. Returning to good posture is a gradual process. Many people can work toward this most comfortably by using various supports until they have the flexibility and strength to maintain this posture on their own. When sitting  with proper posture, your ears will fall over your shoulders and your shoulders will fall over your hips. You should use the back of the chair to support your upper back. Your lower back will be in a neutral position, just slightly arched. You may place a small pillow or folded towel at the base of your lower back for  support.  When working at a desk, create an environment that supports good, upright posture. Without extra support, muscles tire, which leads to excessive strain on joints and other tissues. Keep these recommendations in mind:  CHAIR:  A chair should be able to slide under your desk when your back makes contact with the back of the chair. This allows you to work closely.  The chair's height should allow your eyes to be level with the upper part of your monitor and your hands to be slightly lower than your elbows.  BODY POSITION  Your feet should make contact with the floor. If this is not possible, use a foot rest.  Keep your ears over your shoulders. This will reduce stress on your neck and low back.  INCORRECT SITTING POSTURES  If you are feeling tired and unable to assume a healthy sitting posture, do not slouch or slump. This puts excessive strain on your back tissues, causing more damage and pain. Healthier options include:  Using more support, like a lumbar pillow.  Switching tasks to something that requires you to be upright or walking.  Talking a brief walk.  Lying down to rest in a neutral-spine position.  PROLONGED STANDING WHILE SLIGHTLY LEANING FORWARD  When completing a task that requires you to lean forward while standing in one place for a long time, place either foot up on a stationary 2-4 inch high object to help maintain the best posture. When both feet are on the ground, the lower back tends to lose its slight inward curve. If this curve flattens (or becomes too large), then the back and your other joints will experience too much stress, tire more  quickly, and can cause pain.  CORRECT STANDING POSTURES Proper standing posture should be assumed with all daily activities, even if they only take a few moments, like when brushing your teeth. As in sitting, your ears should fall over your shoulders and your shoulders should fall over your hips. You should keep a slight tension in your abdominal muscles to brace your spine. Your tailbone should point down to the ground, not behind your body, resulting in an over-extended swayback posture.   INCORRECT STANDING POSTURES  Common incorrect standing postures include a forward head, locked knees and/or an excessive swayback. WALKING Walk with an upright posture. Your ears, shoulders and hips should all line-up.  PROLONGED ACTIVITY IN A FLEXED POSITION When completing a task that requires you to bend forward at your waist or lean over a low surface, try to find a way to stabilize 3 out of 4 of your limbs. You can place a  hand or elbow on your thigh or rest a knee on the surface you are reaching across. This will provide you more stability, so that your muscles do not tire as quickly. By keeping your knees relaxed, or slightly bent, you will also reduce stress across your lower back. CORRECT LIFTING TECHNIQUES  DO :  Assume a wide stance. This will provide you more stability and the opportunity to get as close as possible to the object which you are lifting.  Tense your abdominals to brace your spine. Bend at the knees and hips. Keeping your back locked in a neutral-spine position, lift using your leg muscles. Lift with your legs, keeping your back straight.  Test the weight of unknown objects before attempting to lift them.  Try to keep your elbows locked down at your sides in order get the best strength from your shoulders when carrying an object.     Always ask for help when lifting heavy or awkward objects. INCORRECT LIFTING TECHNIQUES DO NOT:   Lock your knees when lifting, even if it is  a small object.  Bend and twist. Pivot at your feet or move your feet when needing to change directions.  Assume that you can safely pick up even a paperclip without proper posture.  Make your husband or children give you massage over neck.

## 2018-05-01 LAB — SEDIMENTATION RATE: Sed Rate: 3 mm/hr (ref 0–20)

## 2018-05-05 LAB — ANA,IFA RA DIAG PNL W/RFLX TIT/PATN
Anti Nuclear Antibody(ANA): NEGATIVE
Rhuematoid fact SerPl-aCnc: <14 IU/mL (ref <14)

## 2018-05-05 LAB — TEST AUTHORIZATION

## 2018-05-05 LAB — HLA-B27 ANTIGEN: HLA-B27 ANTIGEN: NEGATIVE

## 2018-05-05 LAB — RHEUMATOID FACTOR: Rhuematoid fact SerPl-aCnc: <14 IU/mL (ref <14)

## 2018-05-05 LAB — ALDOLASE: Aldolase: 3.3 U/L (ref <=8.1)

## 2018-05-10 ENCOUNTER — Other Ambulatory Visit: Payer: Self-pay | Admitting: Neurology

## 2018-05-11 ENCOUNTER — Encounter: Payer: Self-pay | Admitting: Neurology

## 2018-05-11 MED ORDER — AMPHETAMINE-DEXTROAMPHETAMINE 10 MG PO TABS: 10.0000 mg | ORAL_TABLET | Freq: Every day | ORAL | 0 refills | Status: DC | PRN

## 2018-05-11 MED ORDER — AMPHETAMINE-DEXTROAMPHET ER 20 MG PO CP24: 20.0000 mg | ORAL_CAPSULE | Freq: Every day | ORAL | 0 refills | Status: DC

## 2018-05-11 NOTE — Telephone Encounter (Signed)
Pt is up to date on her appts and due for a refill on her meds. [place] [facility] checked.

## 2018-05-11 NOTE — Telephone Encounter (Signed)
Pt is due for refill and up to date on her appts. [facility] [place] [facility] checked.

## 2018-05-20 ENCOUNTER — Encounter: Payer: BLUE CROSS/BLUE SHIELD | Admitting: Family Medicine

## 2018-05-25 ENCOUNTER — Encounter: Payer: Self-pay | Admitting: Neurology

## 2018-05-25 ENCOUNTER — Other Ambulatory Visit: Payer: Self-pay | Admitting: Adult Health

## 2018-05-25 NOTE — Telephone Encounter (Signed)
Rx registry checked. Last fill date is 04/22/18 for #90. Next OV is 06/02/18.

## 2018-06-02 ENCOUNTER — Encounter: Payer: Self-pay | Admitting: Neurology

## 2018-06-02 ENCOUNTER — Ambulatory Visit: Payer: BLUE CROSS/BLUE SHIELD | Admitting: Neurology

## 2018-06-02 VITALS — BP 123/82 | HR 79 | Ht 65.0 in | Wt 118.0 lb

## 2018-06-02 DIAGNOSIS — G47419 Narcolepsy without cataplexy: Secondary | ICD-10-CM

## 2018-06-02 NOTE — Progress Notes (Signed)
Subjective:    Patient ID: Elizabeth [name] is a 42 y.o. female.  HPI     Interim history:   [name] is a 42 year old right-handed woman with an underlying medical history of allergies, anemia, depression, and anxiety, who presents for follow-up consultation of her hypersomnolence disorder, narcolepsy without cataplexy. The patient is unaccompanied today. I last saw her on 03/02/2018, at which time she reported suboptimal results with her Adderall long-acting. She was also on generic modafinil 2 pills in the morning and 1 pill in the afternoon. She was taking Adderall IR once daily. We start the process of approving her for Xyrem. She emailed in the interim that she had recent foot surgery and delayed her Xyrem.   Today, 06/02/2018 (all dictated new, as well as above notes, some dictation done in note pad or Word, outside of chart, may appear as copied):  She reports having had neuroma removal on 04/07/18, still healing from it. Has had more back pain, on Mobic. She has not started the Xyrem yet. She has noted some facial tics, but knows, that stimulants could be the cause. She feels, she may need to see psychology or psychiatry, but is comfortable talking to her primary care about this. She feels that she can potentially start the Xyrem next week after she returns from her vacation.  The patient's allergies, current medications, family history, past medical history, past social history, past surgical history and problem list were reviewed and updated as appropriate.    Previously (copied from previous notes for reference):   I saw her on 05/28/2017, at which time we talked about her sleepiness history and reviewed her test results in detail. I suggested we start her on Nuvigil. I also provided her with information on Xyrem.   She emailed in the interim in September 2018 reporting that the Provigil was not working well enough, she also had some mood related side effects and reported feeling  foggy headed. I ordered a brain MRI and we switched her over to Provigil.   She is a brain MRI with and without contrast through [facility] on 08/06/2017 and I reviewed results: IMPRESSION:  Normal MRI of the brain.   We called her with her test results.   She saw Ward Givens, nurse practitioner in the interim on 09/04/2017, at which time she had residual sleepiness while on Provigil and low-dose Adderall was added at the time.   Her Provigil was gradually increased as well as her immediate release Adderall. She had a follow-up appointment with Ward Givens on 01/07/2018, at which time she was noted to have lost weight the realm of 15 pounds. She was advised to consider Xyrem.       I first met her on 03/06/2017 at the request of her primary care physician, at which time she reported a longer standing history of daytime somnolence, dating back to even childhood, progressive with time, worse particularly in the previous 6 months. She also reported some parasomnias. I suggested we proceed extended sleep study testing in the form of nocturnal polysomnogram, followed by a daytime nap study. She was advised to taper off her antidepressant medications. She had a baseline sleep study on 05/18/2017, followed by a 4 nap MSLT on 05/19/2017. I went over her test results with her in detail today. Sleep latency for the nighttime sleep study was 22 minutes, REM latency slightly reduced at 57.5 minutes. Sleep efficiency was 85.3%. She had an increased percentage of REM sleep at 34.6%, normal percentage  for slow-wave sleep, Daytime decreased percentage of stage II sleep, she had no significant sleep disordered breathing, AHI was 0 per hour, she did have near severe PLMS with an index of 48.8 per hour but no significant arousals. Next a nap study showed a mean sleep latency of 4.25 minutes with 2 REM onset naps. 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   03/06/2017: (She) reports a  several year history of sleepiness, progressive, particularly worse in the past 6 months. She recalls being sleepy as a child even. She remembers falling asleep in class. She does not wake up with a sense of gasping. She has not been told that she has apneic breathing pauses. She does have nocturia once per night. She reports vivid dreams and a history of sleepwalking and sleep talking as well as night terrors. Her father has a history of parasomnias and vivid dreams as well. There is no family history of narcolepsy or sleep apnea. She suspects that her mom may have sleep apnea and needs to be tested for this. She denies restless leg symptoms. She has woken up occasionally with a headache. She tries to be in bed between 10:30 and 11. Wakeup time 6 AM. She has a difficult time waking up in the mornings. She feels like she has to take a nap on a daily basis. She will go to bed to take naps and sometimes sleeps for 1 or 2 hours. She reports having had dreams in her naps. She usually goes to her bedroom and takes a nap in her bed. She does not watch TV in her bedroom. At night, she has a difficult time going back to sleep once she is up. Sometimes it takes her an hour to fall asleep when she wants to take a nap. She does feel like she is tired enough to nap. She has dozed off at the wheel briefly. She denies symptoms of cataplexy or sleep paralysis or hypnagogic or hypnopompic hallucinations. She does report vivid dreams, no acting out of her dreams. She has a longer standing history of anxiety disorder and was started on Wellbutrin and Lexapro 3 or 4 years ago which was very helpful. She and her family moved from [place] some 2 years ago. I reviewed your office note from 02/06/2017. Her Epworth sleepiness score is 15 out of 24 today, fatigue score is 55 out of 63. She lives at home with her husband and 2 daughters, ages 75 and 43. She home schools. She does not work outside the home. She quit smoking in 2005. She  drinks alcohol in the form of wine, one glass per day. She drinks caffeine in the form of coffee, 2 cups per day, usually in the mornings. She has had intermittent snoring. She has a history of deviated septum and is scheduled to have septoplasty next month.   Her Past Medical History Is Significant For: Past Medical History:  Diagnosis Date  . Allergy   . Anemia   . Anxiety state 02/15/2016  . Arthritis   . Depression     Her Past Surgical History Is Significant For: Past Surgical History:  Procedure Laterality Date  . CESAREAN SECTION     X2  . DILATION AND CURETTAGE OF UTERUS    . NASAL SEPTUM SURGERY    . ROOT CANAL     x 2    Her Family History Is Significant For: Family History  Problem Relation Age of Onset  . Heart disease Mother   .  Depression Mother   . Anxiety disorder Mother   . Diabetes Father   . Depression Father   . Anxiety disorder Father   . Stroke Maternal Grandmother   . Parkinsonism Paternal Grandmother   . Breast cancer Paternal Grandmother   . Bladder Cancer Paternal Grandfather     Her Social History Is Significant For: Social History   Socioeconomic History  . Marital status: Married    Spouse name: Not on file  . Number of children: 2  . Years of education: Masters  . Highest education level: Not on file  Occupational History  . Not on file  Social Needs  . Financial resource strain: Not on file  . Food insecurity:    Worry: Not on file    Inability: Not on file  . Transportation needs:    Medical: Not on file    Non-medical: Not on file  Tobacco Use  . Smoking status: Former Smoker    Last attempt to quit: 10/21/2004    Years since quitting: 13.6  . Smokeless tobacco: Never Used  . Tobacco comment: quit 11 yrs ago  Substance and Sexual Activity  . Alcohol use: No    Alcohol/week: 0.0 standard drinks  . Drug use: Not on file  . Sexual activity: Yes    Birth control/protection: Surgical  Lifestyle  . Physical activity:     Days per week: Not on file    Minutes per session: Not on file  . Stress: Not on file  Relationships  . Social connections:    Talks on phone: Not on file    Gets together: Not on file    Attends religious service: Not on file    Active member of club or organization: Not on file    Attends meetings of clubs or organizations: Not on file    Relationship status: Not on file  Other Topics Concern  . Not on file  Social History Narrative   2 caffeine drinks a day     Her Allergies Are:  Allergies  Allergen Reactions  . Sulfa Antibiotics   :   Her Current Medications Are:  Outpatient Encounter Medications as of 06/02/2018  Medication Sig  . amphetamine-dextroamphetamine (ADDERALL XR) 20 MG 24 hr capsule Take 1 capsule (20 mg total) by mouth daily.  Marland Kitchen amphetamine-dextroamphetamine (ADDERALL) 10 MG tablet Take 1 tablet (10 mg total) by mouth daily as needed.  . fluticasone (FLONASE) 50 MCG/ACT nasal spray Place 2 sprays into both nostrils daily as needed for allergies or rhinitis.  Marland Kitchen ibuprofen (ADVIL,MOTRIN) 200 MG tablet Take 200 mg by mouth as needed.  . meloxicam (MOBIC) 15 MG tablet Take 1 tablet (15 mg total) by mouth daily.  . modafinil (PROVIGIL) 100 MG tablet TAKE 2 TABLETS BY MOUTH EVERY MORNING, AND TAKE 1 TABLET AFTER LUNCH NO LATER THAN 3PM  . XYREM 500 MG/ML SOLN    No facility-administered encounter medications on file as of 06/02/2018.   :  Review of Systems:  Out of a complete 14 point review of systems, all are reviewed and negative with the exception of these symptoms as listed below:  Review of Systems  Neurological:       Pt presents today to follow up on her narcolepsy. Pt has not started xyrem. She recently had surgery on her foot. While at a narcolepsy conference, she was told that she should completely heal from the foot surgery before starting xyrem.    Objective:  Neurological Exam  Physical  Exam Physical Examination:   Vitals:   06/02/18 1044   BP: 123/82  Pulse: 79   General Examination: The patient is a very pleasant 42 y.o. female in no acute distress. She appears well-developed and well-nourished and well groomed.   HEENT:Normocephalic, atraumatic, pupils are equal, round and reactive to light and accommodation. Contact lenses in place. Extraocular tracking is good without limitation to gaze excursion or nystagmus noted. Normal smooth pursuit is noted. Hearing is grossly intact. Face is symmetric with normal facial animation and normal facial sensation. Speech is clear with no dysarthria noted. There is no hypophonia. There is no lip, neck/head, jaw or voice tremor. Not tic-like movements. Neck is supple with full range of passive and active motion. There are no carotid bruits on auscultation. Oropharynx exam reveals: no significant change noted. Mallampati is class II. Tongue protrudes centrally and palate elevates symmetrically.   Chest:Clear to auscultation without wheezing, rhonchi or crackles noted.  Heart:S1+S2+0, regular and normal without murmurs, rubs or gallops noted.   Abdomen:Soft, non-tender and non-distended with normal bowel sounds appreciated on auscultation.  Extremities:There is nopitting edema in the distal lower extremities bilaterally.  Skin: Warm and dry without trophic changes noted.  Musculoskeletal: exam reveals no obvious joint deformities, tenderness or joint swelling or erythema.   Neurologically:  Mental status: The patient is awake, alert and oriented in all 4 spheres. Herimmediate and remote memory, attention, language skills and fund of knowledge are appropriate. There is no evidence of aphasia, agnosia, apraxia or anomia. Speech is clear with normal prosody and enunciation. Thought process is linear. Mood is normaland affect is normal.  Cranial nerves II - XII are as described above under HEENT exam.  Motor exam: Normal bulk, strength and tone is noted. There is no tremor. Fine  motor skills and coordination:grosslyintact. No motor tics.  Cerebellar testing: No dysmetria or intention tremor. Sensory exam: intact to light touch in the upper and lower extremities.  Gait, station and balance: Shestands with nodifficulty. No veering to one side is noted. No leaning to one side is noted. Posture is age-appropriate and stance is narrow based. Gait shows normalstride length and normalpace. No problems turning are noted. Tandem gait okay.  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 She had sleep study testing, and her daytime sleep study showed a mean sleep latency of 4.25 minutes with 2 REM onset naps. She does not have a telltale history of cataplexy at this time. She has been on generic Provigil which was increased gradually as well as a stimulant was added. She is now on Adderall long-acting and immediate release. Unfortunately, she has lost weight over time. I initially started her on Nuvigil but she had side effects including headaches. She wonders if she has Tourette's. I do not see any motor tics currently or phonic tics. She is advised that stimulants can sometimes cause minor motor tics. Her sleepiness symptoms are suboptimally controlled on her current regimen of Provigil as well as a stimulant. She had an MRI brain in the interim which was unremarkable. Physical exam and neurological exam continued to be nonfocal. She was advised to start Xyrem at her last appointment in May 2019 but had interim foot surgery and is still healing from this. She plans to start Xyrem after she returns from her vacation next week. I would like for her  to return in about 3-4 months to see Vaughan Browner, nurse practitioner. She knows to call us or email through my chart in the interim. She is  not quite due for a refill on her stimulant and Provigil. We will do this automatically when the prescriptions are due for renewal. I answered all her questions today and she was in agreement with the plan. She will talk to her primary care physician about a referral to a psychologist or psychiatrist for her stress and mood disorder.  I spent 20 minutes in total face-to-face time with the patient, more than 50% of which was spent in counseling and coordination of care, reviewing test results, reviewing medication and discussing or reviewing the diagnosis of narcolepsy, its prognosis and treatment options. Pertinent laboratory and imaging test results that were available during this visit with the patient were reviewed by me and considered in my medical decision making (see chart for details).

## 2018-06-02 NOTE — Patient Instructions (Addendum)
We will continue with your current medication regimen for now. Please start the Xyrem when you are ready.  I would hope, once you are stble on the the Xyrem, we can scale back on your other meds, including the Adderall XR and IR and/or the Provigil.

## 2018-06-08 ENCOUNTER — Encounter: Payer: Self-pay | Admitting: Neurology

## 2018-06-08 ENCOUNTER — Other Ambulatory Visit: Payer: Self-pay

## 2018-06-08 ENCOUNTER — Other Ambulatory Visit: Payer: Self-pay | Admitting: Neurology

## 2018-06-08 MED ORDER — AMPHETAMINE-DEXTROAMPHET ER 20 MG PO CP24: 20.0000 mg | ORAL_CAPSULE | Freq: Every day | ORAL | 0 refills | Status: DC

## 2018-06-08 MED ORDER — AMPHETAMINE-DEXTROAMPHETAMINE 10 MG PO TABS: 10.0000 mg | ORAL_TABLET | Freq: Every day | ORAL | 0 refills | Status: DC | PRN

## 2018-06-08 NOTE — Telephone Encounter (Signed)
Pt sent another mychart message asking that her [facility] [place] [place] because she is on vacation.

## 2018-06-08 NOTE — Telephone Encounter (Signed)
Adderall RXs last ordered on 05/11/18. [place] [facility] checked. Pt is up to date on her appts. Will send to Dr. Rexene Alberts for review and approval.

## 2018-06-17 NOTE — Progress Notes (Signed)
[place] [facility] [place], [place], [place] [address] [phone]  Date:  06/18/2018   Name:  Elizabeth [name]   DOB:  March 22, 1976   MRN:  [id number]  PCP:  Darreld Mclean, MD    Chief Complaint: Sore Throat (sore throat-improved, congestion, ear ache)   History of Present Illness:  Elizabeth [name] is a 42 y.o. very pleasant female patient who presents with the following:  Generally healthy young woman here today with illness- she was sick all last seek and the start of this week, but then started to feel better finally yesterday and today. However she decided to keep appt to be on the safe side Her sx were ST, no fever Some pain into her right ear which seemed to radiate from her throat No cough Her sx did improve yesterday and then today she felt pretty well No vomiting or diarrhea She was not sure if this might have been allergies  She has a history of canker sores and wonders if there is any treatment she could use for this on an as needed basis Otherwise she is feeling well and is generally in good health overall   Patient Active Problem List   Diagnosis Date Noted  . Metatarsalgia of right foot 09/20/2016  . Anxiety state 02/15/2016    Past Medical History:  Diagnosis Date  . Allergy   . Anemia   . Anxiety state 02/15/2016  . Arthritis   . Depression     Past Surgical History:  Procedure Laterality Date  . CESAREAN SECTION     X2  . DILATION AND CURETTAGE OF UTERUS    . NASAL SEPTUM SURGERY    . ROOT CANAL     x 2    Social History   Tobacco Use  . Smoking status: Former Smoker    Last attempt to quit: 10/21/2004    Years since quitting: 13.6  . Smokeless tobacco: Never Used  . Tobacco comment: quit 11 yrs ago  Substance Use Topics  . Alcohol use: No    Alcohol/week: 0.0 standard drinks  . Drug use: Not on file    Family History  Problem Relation Age of Onset  . Heart disease Mother    . Depression Mother   . Anxiety disorder Mother   . Diabetes Father   . Depression Father   . Anxiety disorder Father   . Stroke Maternal Grandmother   . Parkinsonism Paternal Grandmother   . Breast cancer Paternal Grandmother   . Bladder Cancer Paternal Grandfather     Allergies  Allergen Reactions  . Sulfa Antibiotics     Medication list has been reviewed and updated.  Current Outpatient Medications on File Prior to Visit  Medication Sig Dispense Refill  . amphetamine-dextroamphetamine (ADDERALL XR) 20 MG 24 hr capsule Take 1 capsule (20 mg total) by mouth daily. 30 capsule 0  . ibuprofen (ADVIL,MOTRIN) 200 MG tablet Take 200 mg by mouth as needed.    . meloxicam (MOBIC) 15 MG tablet Take 1 tablet (15 mg total) by mouth daily. 30 tablet 1  . modafinil (PROVIGIL) 100 MG tablet TAKE 2 TABLETS BY MOUTH EVERY MORNING, AND TAKE 1 TABLET AFTER LUNCH NO LATER THAN 3PM (Patient taking differently: TAKE 2 TABLETS BY MOUTH EVERY MORNING) 90 tablet 0  . XYREM 500 MG/ML SOLN     . amphetamine-dextroamphetamine (ADDERALL) 10 MG tablet Take 1 tablet (10 mg total) by mouth  daily as needed. (Patient not taking: Reported on 06/18/2018) 30 tablet 0  . fluticasone (FLONASE) 50 MCG/ACT nasal spray Place 2 sprays into both nostrils daily as needed for allergies or rhinitis.     No current facility-administered medications on file prior to visit.     Review of Systems:  As per HPI- otherwise negative. No fever or chills No nausea or vomiting  Physical Examination: Vitals:   06/18/18 1326  BP: 130/80  Pulse: 92  Resp: 16  Temp: 97.9 F (36.6 C)  SpO2: 98%   Vitals:   06/18/18 1326  Weight: 116 lb (52.6 kg)  Height: 5\' 5"  (1.651 m)   Body mass index is 19.3 kg/m. Ideal Body Weight: Weight in (lb) to have BMI = 25: 149.9  GEN: WDWN, NAD, Non-toxic, A & O x 3, looks well, slim build HEENT: Atraumatic, Normocephalic. Neck supple. No masses, No LAD.  Bilateral TM wnl, oropharynx  normal.  PEERL,EOMI.   No current canker sore Ears and Nose: No external deformity. CV: RRR, No M/G/R. No JVD. No thrill. No extra heart sounds. PULM: CTA B, no wheezes, crackles, rhonchi. No retractions. No resp. distress. No accessory muscle use. EXTR: No c/c/e NEURO Normal gait.  PSYCH: Normally interactive. Conversant. Not depressed or anxious appearing.  Calm demeanor.   Results for orders placed or performed in visit on 06/18/18  POCT rapid strep A  Result Value Ref Range   Rapid Strep A Screen Negative Negative    Assessment and Plan: Sore throat - Plan: POCT rapid strep A  Canker sore - Plan: clobetasol (TEMOVATE) 0.05 % GEL  Here today with recent illness Rapid strep is negative today Gave her an rx for clobetasol gel to use prn for canker sore  Meds ordered this encounter  Medications  . clobetasol (TEMOVATE) 0.05 % GEL    Sig: Apply to canker sore up to three times a day as needed    Dispense:  15 each    Refill:  3     Signed Lamar Blinks, MD

## 2018-06-18 ENCOUNTER — Encounter: Payer: Self-pay | Admitting: Family Medicine

## 2018-06-18 ENCOUNTER — Ambulatory Visit: Payer: BLUE CROSS/BLUE SHIELD | Admitting: Family Medicine

## 2018-06-18 VITALS — BP 130/80 | HR 92 | Temp 97.9°F | Resp 16 | Ht 65.0 in | Wt 116.0 lb

## 2018-06-18 DIAGNOSIS — J029 Acute pharyngitis, unspecified: Secondary | ICD-10-CM | POA: Diagnosis not present

## 2018-06-18 DIAGNOSIS — K120 Recurrent oral aphthae: Secondary | ICD-10-CM

## 2018-06-18 LAB — POCT RAPID STREP A (OFFICE): RAPID STREP A SCREEN: NEGATIVE

## 2018-06-18 MED ORDER — CLOBETASOL PROPIONATE 0.05 % EX GEL: CUTANEOUS | 3 refills | Status: DC

## 2018-06-18 NOTE — Patient Instructions (Signed)
Your strep was negative- you likely had a viral illness of some sort I sent in an rx for topical clobetasol gel that you can apply to canker sores as needed- it will likely say not to eat the gel,but a tiny amount on the sores is ok!  Let me know if you don't continue to improve

## 2018-06-29 ENCOUNTER — Other Ambulatory Visit: Payer: Self-pay | Admitting: Neurology

## 2018-06-29 NOTE — Telephone Encounter (Signed)
[place] [facility] checked. Pt is due for a refill on modafinil and up to date on her appts. Will send to Dr. Rexene Alberts for review and approval.

## 2018-06-30 ENCOUNTER — Other Ambulatory Visit: Payer: Self-pay | Admitting: Family Medicine

## 2018-07-11 ENCOUNTER — Other Ambulatory Visit: Payer: Self-pay | Admitting: Neurology

## 2018-07-11 ENCOUNTER — Encounter: Payer: Self-pay | Admitting: Family Medicine

## 2018-07-13 ENCOUNTER — Encounter: Payer: Self-pay | Admitting: Neurology

## 2018-07-13 MED ORDER — AMPHETAMINE-DEXTROAMPHETAMINE 10 MG PO TABS: 10.0000 mg | ORAL_TABLET | Freq: Every day | ORAL | 0 refills | Status: DC | PRN

## 2018-07-13 MED ORDER — AMPHETAMINE-DEXTROAMPHET ER 20 MG PO CP24: 20.0000 mg | ORAL_CAPSULE | Freq: Every day | ORAL | 0 refills | Status: DC

## 2018-07-13 NOTE — Telephone Encounter (Signed)
Pt is up to date on her appts and is due for a refill. [place] [facility] checked.

## 2018-07-14 ENCOUNTER — Encounter: Payer: Self-pay | Admitting: Adult Health

## 2018-07-14 MED ORDER — AMPHETAMINE-DEXTROAMPHET ER 20 MG PO CP24: 20.0000 mg | ORAL_CAPSULE | Freq: Every day | ORAL | 0 refills | Status: DC

## 2018-07-14 MED ORDER — AMPHETAMINE-DEXTROAMPHETAMINE 10 MG PO TABS: 10.0000 mg | ORAL_TABLET | Freq: Every day | ORAL | 0 refills | Status: DC | PRN

## 2018-07-14 NOTE — Telephone Encounter (Signed)
Pt asking for [facility] [place]. Yesterday, the RXs were sent to [place] [place] [facility].

## 2018-07-14 NOTE — Addendum Note (Signed)
Addended by: Lester [place] A on: 07/14/2018 08:08 AM   Modules accepted: Orders

## 2018-07-19 NOTE — Progress Notes (Addendum)
Elizabeth [name] at [facility] [place], [place], [place] [address] [phone]  Date:  07/22/2018   Name:  Elizabeth [name]   DOB:  [birth year]   MRN:  [id number]  PCP:  Darreld Mclean, MD    Chief Complaint: Annual Exam (referral to family therapist, counselor)   History of Present Illness:  Elizabeth [name] is a 42 y.o. 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 Sheldon [name] Mammo:6/18- she will get this done soon Labs:  due Immun: flu due- given today  Physically she is feeling overall good  She is using adderall and Provigil now for her narcolepsy per neurology She is trying to attend a narcolepsy support group when she can She is also set up to see a new neurologist in [place] who specializes in narcolepsy   She is interested in seeing a family therapist to discuss varius stresses in the family and I gave her a pamphlet for Elizabeth [name]  Wt Readings from Last 3 Encounters:  07/22/18 115 lb 9.6 oz (52.4 kg)  06/18/18 116 lb (52.6 kg)  06/02/18 118 lb (53.5 kg)   Her weight is down a few lbs- she is not as hungry due to adderall use. Discussed timing meals so this will not be as much of a problem and she will try to eat more Exercise has been limited due to her foot - surgery for a neuroma per Dr. Paulla [name]  She is not sure if she will be able to get back to running again but means to try  Patient Active Problem List   Diagnosis Date Noted  . Metatarsalgia of right foot 09/20/2016  . Anxiety state 02/15/2016    Past Medical History:  Diagnosis Date  . Allergy   .  Anemia   . Anxiety state 02/15/2016  . Arthritis   . Depression     Past Surgical History:  Procedure Laterality Date  . CESAREAN SECTION     X2  . DILATION AND CURETTAGE OF UTERUS    . NASAL SEPTUM SURGERY    . ROOT CANAL     x 2    Social History   Tobacco Use  . Smoking status: Former Smoker    Last attempt to quit: 10/21/2004    Years since quitting: 13.7  . Smokeless tobacco: Never Used  . Tobacco comment: quit 11 yrs ago  Substance Use Topics  . Alcohol use: No    Alcohol/week: 0.0 standard drinks  . Drug use: Not on file    Family History  Problem Relation Age of Onset  . Heart disease Mother   . Depression Mother   . Anxiety disorder Mother   . Diabetes Father   . Depression Father   . Anxiety disorder Father   . Stroke Maternal Grandmother   . Parkinsonism Paternal Grandmother   . Breast cancer Paternal Grandmother   . Bladder Cancer Paternal Grandfather     Allergies  Allergen Reactions  . Sulfa Antibiotics     Medication list has been  reviewed and updated.  Current Outpatient Medications on File Prior to Visit  Medication Sig Dispense Refill  . amphetamine-dextroamphetamine (ADDERALL XR) 20 MG 24 hr capsule Take 1 capsule (20 mg total) by mouth daily. 30 capsule 0  . amphetamine-dextroamphetamine (ADDERALL) 10 MG tablet Take 1 tablet (10 mg total) by mouth daily as needed. 30 tablet 0  . clobetasol (TEMOVATE) 0.05 % GEL Apply to canker sore up to three times a day as needed 15 each 3  . fluticasone (FLONASE) 50 MCG/ACT nasal spray Place 2 sprays into both nostrils daily as needed for allergies or rhinitis.    . meloxicam (MOBIC) 15 MG tablet TAKE 1 TABLET BY MOUTH EVERY DAY 30 tablet 5  . modafinil (PROVIGIL) 100 MG tablet TAKE 2 TABLETS BY MOUTH EVERY MORNING, AND TAKE 1 TABLET AFTER LUNCH NO LATER THAN 3PM 90 tablet 0   No current facility-administered medications on file prior to visit.     Review of Systems:  As per HPI- otherwise  negative. No CP or SOB Decreased appetite as above    Physical Examination: Vitals:   07/22/18 1338  BP: 118/70  Pulse: 99  Resp: 16  Temp: 99 F (37.2 C)  SpO2: 99%   Vitals:   07/22/18 1338  Weight: 115 lb 9.6 oz (52.4 kg)  Height: 5\' 5"  (1.651 m)   Body mass index is 19.24 kg/m. Ideal Body Weight: Weight in (lb) to have BMI = 25: 149.9  GEN: WDWN, NAD, Non-toxic, A & O x 3, looks well  HEENT: Atraumatic, Normocephalic. Neck supple. No masses, No LAD.  Bilateral TM wnl, oropharynx normal.  PEERL,EOMI.   Ears and Nose: No external deformity. CV: RRR, No M/G/R. No JVD. No thrill. No extra heart sounds. PULM: CTA B, no wheezes, crackles, rhonchi. No retractions. No resp. distress. No accessory muscle use. ABD: S, NT, ND EXTR: No c/c/e Normal DTR of all extremities  NEURO Normal gait.  PSYCH: Normally interactive. Conversant. Not depressed or anxious appearing.  Calm demeanor.    Assessment and Plan: Physical exam  Screening for deficiency anemia - Plan: CBC  Screening for diabetes mellitus - Plan: Comprehensive metabolic panel, Hemoglobin A1c  Screening for hyperlipidemia - Plan: Lipid panel  Need for influenza vaccination - Plan: Flu Vaccine QUAD 6+ mos PF IM (Fluarix Quad PF)  CPE today Flu shot given Labs pending Discussed family stressors- she is going to look into a family therapist for them to use  Will plan further follow- up pending labs.    Signed Elizabeth Blinks, MD  Received her labs, message to pt  Results for orders placed or performed in visit on 07/22/18  CBC  Result Value Ref Range   WBC 6.2 4.0 - 10.5 K/uL   RBC 4.08 3.87 - 5.11 Mil/uL   Platelets 266.0 150.0 - 400.0 K/uL   Hemoglobin 12.4 12.0 - 15.0 g/dL   HCT 36.3 36.0 - 46.0 %   MCV 89.0 78.0 - 100.0 fl   MCHC 34.1 30.0 - 36.0 g/dL   RDW 13.5 11.5 - 15.5 %  Comprehensive metabolic panel  Result Value Ref Range   Sodium 137 135 - 145 mEq/L   Potassium 3.8 3.5 - 5.1 mEq/L    Chloride 101 96 - 112 mEq/L   CO2 31 19 - 32 mEq/L   Glucose, Bld 95 70 - 99 mg/dL   BUN 8 6 - 23 mg/dL   Creatinine, Ser 0.69 0.40 - 1.20 mg/dL   Total Bilirubin 0.3  0.2 - 1.2 mg/dL   Alkaline Phosphatase 36 (L) 39 - 117 U/L   AST 13 0 - 37 U/L   ALT 9 0 - 35 U/L   Total Protein 7.0 6.0 - 8.3 g/dL   Albumin 4.4 3.5 - 5.2 g/dL   Calcium 9.0 8.4 - 10.5 mg/dL   GFR 98.51 >60.00 mL/min  Hemoglobin A1c  Result Value Ref Range   Hgb A1c MFr Bld 5.3 4.6 - 6.5 %  Lipid panel  Result Value Ref Range   Cholesterol 163 0 - 200 mg/dL   Triglycerides 54.0 0.0 - 149.0 mg/dL   HDL 70.10 >39.00 mg/dL   VLDL 10.8 0.0 - 40.0 mg/dL   LDL Cholesterol 82 0 - 99 mg/dL   Total CHOL/HDL Ratio 2    NonHDL 93.23

## 2018-07-22 ENCOUNTER — Ambulatory Visit (INDEPENDENT_AMBULATORY_CARE_PROVIDER_SITE_OTHER): Payer: BLUE CROSS/BLUE SHIELD | Admitting: Family Medicine

## 2018-07-22 ENCOUNTER — Encounter: Payer: Self-pay | Admitting: Family Medicine

## 2018-07-22 VITALS — BP 118/70 | HR 99 | Temp 99.0°F | Resp 16 | Ht 65.0 in | Wt 115.6 lb

## 2018-07-22 DIAGNOSIS — Z23 Encounter for immunization: Secondary | ICD-10-CM | POA: Diagnosis not present

## 2018-07-22 DIAGNOSIS — Z0000 Encounter for general adult medical examination without abnormal findings: Secondary | ICD-10-CM

## 2018-07-22 DIAGNOSIS — Z130 Encounter for screening for diseases of the blood and blood-forming organs and certain disorders involving the immune mechanism: Secondary | ICD-10-CM

## 2018-07-22 DIAGNOSIS — Z13220 Encounter for screening for lipoid disorders: Secondary | ICD-10-CM

## 2018-07-22 DIAGNOSIS — Z131 Encounter for screening for diabetes mellitus: Secondary | ICD-10-CM | POA: Diagnosis not present

## 2018-07-22 LAB — COMPREHENSIVE METABOLIC PANEL
ALBUMIN: 4.4 g/dL (ref 3.5–5.2)
ALT: 9 U/L (ref 0–35)
AST: 13 U/L (ref 0–37)
Alkaline Phosphatase: 36 U/L — ABNORMAL LOW (ref 39–117)
BUN: 8 mg/dL (ref 6–23)
CHLORIDE: 101 meq/L (ref 96–112)
CO2: 31 mEq/L (ref 19–32)
CREATININE: 0.69 mg/dL (ref 0.40–1.20)
Calcium: 9 mg/dL (ref 8.4–10.5)
GFR: 98.51 mL/min (ref >60.00)
Glucose, Bld: 95 mg/dL (ref 70–99)
Potassium: 3.8 mEq/L (ref 3.5–5.1)
SODIUM: 137 meq/L (ref 135–145)
Total Bilirubin: 0.3 mg/dL (ref 0.2–1.2)
Total Protein: 7 g/dL (ref 6.0–8.3)

## 2018-07-22 LAB — CBC
HCT: 36.3 % (ref 36.0–46.0)
Hemoglobin: 12.4 g/dL (ref 12.0–15.0)
MCHC: 34.1 g/dL (ref 30.0–36.0)
MCV: 89 fl (ref 78.0–100.0)
Platelets: 266 10*3/uL (ref 150.0–400.0)
RBC: 4.08 Mil/uL (ref 3.87–5.11)
RDW: 13.5 % (ref 11.5–15.5)
WBC: 6.2 10*3/uL (ref 4.0–10.5)

## 2018-07-22 LAB — HEMOGLOBIN A1C: HEMOGLOBIN A1C: 5.3 % (ref 4.6–6.5)

## 2018-07-22 LAB — LIPID PANEL
Cholesterol: 163 mg/dL (ref 0–200)
HDL: 70.1 mg/dL (ref >39.00)
LDL CALC: 82 mg/dL (ref 0–99)
NonHDL: 93.23
Total CHOL/HDL Ratio: 2
Triglycerides: 54 mg/dL (ref 0.0–149.0)
VLDL: 10.8 mg/dL (ref 0.0–40.0)

## 2018-07-22 NOTE — Patient Instructions (Signed)
Great to see you today as always!  I will be in touch with your labs asap Please do get your mammogram soon  Please call the counseling office main number and explain what you are looking for- they can help match you with a location and counselor that will be a good fit for you  Health Maintenance, Female Adopting a healthy lifestyle and getting preventive care can go a long way to promote health and wellness. Talk with your health care provider about what schedule of regular examinations is right for you. This is a good chance for you to check in with your provider about disease prevention and staying healthy. In between checkups, there are plenty of things you can do on your own. Experts have done a lot of research about which lifestyle changes and preventive measures are most likely to keep you healthy. Ask your health care provider for more information. Weight and diet Eat a healthy diet  Be sure to include plenty of vegetables, fruits, low-fat dairy products, and lean protein.  Do not eat a lot of foods high in solid fats, added sugars, or salt.  Get regular exercise. This is one of the most important things you can do for your health. ? Most adults should exercise for at least 150 minutes each week. The exercise should increase your heart rate and make you sweat (moderate-intensity exercise). ? Most adults should also do strengthening exercises at least twice a week. This is in addition to the moderate-intensity exercise.  Maintain a healthy weight  Body mass index (BMI) is a measurement that can be used to identify possible weight problems. It estimates body fat based on height and weight. Your health care provider can help determine your BMI and help you achieve or maintain a healthy weight.  For females 4 years of age and older: ? A BMI below 18.5 is considered underweight. ? A BMI of 18.5 to 24.9 is normal. ? A BMI of 25 to 29.9 is considered overweight. ? A BMI of 30 and above  is considered obese.  Watch levels of cholesterol and blood lipids  You should start having your blood tested for lipids and cholesterol at 43 years of age, then have this test every 5 years.  You may need to have your cholesterol levels checked more often if: ? Your lipid or cholesterol levels are high. ? You are older than 43 years of age. ? You are at high risk for heart disease.  Cancer screening Lung Cancer  Lung cancer screening is recommended for adults 18-71 years old who are at high risk for lung cancer because of a history of smoking.  A yearly low-dose CT scan of the lungs is recommended for people who: ? Currently smoke. ? Have quit within the past 15 years. ? Have at least a 30-pack-year history of smoking. A pack year is smoking an average of one pack of cigarettes a day for 1 year.  Yearly screening should continue until it has been 15 years since you quit.  Yearly screening should stop if you develop a health problem that would prevent you from having lung cancer treatment.  Breast Cancer  Practice breast self-awareness. This means understanding how your breasts normally appear and feel.  It also means doing regular breast self-exams. Let your health care provider know about any changes, no matter how small.  If you are in your 20s or 30s, you should have a clinical breast exam (CBE) by a health care provider  every 1-3 years as part of a regular health exam.  If you are 41 or older, have a CBE every year. Also consider having a breast X-ray (mammogram) every year.  If you have a family history of breast cancer, talk to your health care provider about genetic screening.  If you are at high risk for breast cancer, talk to your health care provider about having an MRI and a mammogram every year.  Breast cancer gene (BRCA) assessment is recommended for women who have family members with BRCA-related cancers. BRCA-related cancers  include: ? Breast. ? Ovarian. ? Tubal. ? Peritoneal cancers.  Results of the assessment will determine the need for genetic counseling and BRCA1 and BRCA2 testing.  Cervical Cancer Your health care provider may recommend that you be screened regularly for cancer of the pelvic organs (ovaries, uterus, and vagina). This screening involves a pelvic examination, including checking for microscopic changes to the surface of your cervix (Pap test). You may be encouraged to have this screening done every 3 years, beginning at age 65.  For women ages 44-65, health care providers may recommend pelvic exams and Pap testing every 3 years, or they may recommend the Pap and pelvic exam, combined with testing for human papilloma virus (HPV), every 5 years. Some types of HPV increase your risk of cervical cancer. Testing for HPV may also be done on women of any age with unclear Pap test results.  Other health care providers may not recommend any screening for nonpregnant women who are considered low risk for pelvic cancer and who do not have symptoms. Ask your health care provider if a screening pelvic exam is right for you.  If you have had past treatment for cervical cancer or a condition that could lead to cancer, you need Pap tests and screening for cancer for at least 20 years after your treatment. If Pap tests have been discontinued, your risk factors (such as having a new sexual partner) need to be reassessed to determine if screening should resume. Some women have medical problems that increase the chance of getting cervical cancer. In these cases, your health care provider may recommend more frequent screening and Pap tests.  Colorectal Cancer  This type of cancer can be detected and often prevented.  Routine colorectal cancer screening usually begins at 43 years of age and continues through 42 years of age.  Your health care provider may recommend screening at an earlier age if you have risk factors  for colon cancer.  Your health care provider may also recommend using home test kits to check for hidden blood in the stool.  A small camera at the end of a tube can be used to examine your colon directly (sigmoidoscopy or colonoscopy). This is done to check for the earliest forms of colorectal cancer.  Routine screening usually begins at age 19.  Direct examination of the colon should be repeated every 5-10 years through 43 years of age. However, you may need to be screened more often if early forms of precancerous polyps or small growths are found.  Skin Cancer  Check your skin from head to toe regularly.  Tell your health care provider about any new moles or changes in moles, especially if there is a change in a mole's shape or color.  Also tell your health care provider if you have a mole that is larger than the size of a pencil eraser.  Always use sunscreen. Apply sunscreen liberally and repeatedly throughout the day.  Protect yourself by wearing long sleeves, pants, a wide-brimmed hat, and sunglasses whenever you are outside.  Heart disease, diabetes, and high blood pressure  High blood pressure causes heart disease and increases the risk of stroke. High blood pressure is more likely to develop in: ? People who have blood pressure in the high end of the normal range (130-139/85-89 mm Hg). ? People who are overweight or obese. ? People who are African American.  If you are 57-39 years of age, have your blood pressure checked every 3-5 years. If you are 19 years of age or older, have your blood pressure checked every year. You should have your blood pressure measured twice-once when you are at a hospital or clinic, and once when you are not at a hospital or clinic. Record the average of the two measurements. To check your blood pressure when you are not at a hospital or clinic, you can use: ? An automated blood pressure machine at a pharmacy. ? A home blood pressure monitor.  If  you are between 19 years and 38 years old, ask your health care provider if you should take aspirin to prevent strokes.  Have regular diabetes screenings. This involves taking a blood sample to check your fasting blood sugar level. ? If you are at a normal weight and have a low risk for diabetes, have this test once every three years after 43 years of age. ? If you are overweight and have a high risk for diabetes, consider being tested at a younger age or more often. Preventing infection Hepatitis B  If you have a higher risk for hepatitis B, you should be screened for this virus. You are considered at high risk for hepatitis B if: ? You were born in a country where hepatitis B is common. Ask your health care provider which countries are considered high risk. ? Your parents were born in a high-risk country, and you have not been immunized against hepatitis B (hepatitis B vaccine). ? You have HIV or AIDS. ? You use needles to inject street drugs. ? You live with someone who has hepatitis B. ? You have had sex with someone who has hepatitis B. ? You get hemodialysis treatment. ? You take certain medicines for conditions, including cancer, organ transplantation, and autoimmune conditions.  Hepatitis C  Blood testing is recommended for: ? Everyone born from [birth year] through 1966. ? Anyone with known risk factors for hepatitis C.  Sexually transmitted infections (STIs)  You should be screened for sexually transmitted infections (STIs) including gonorrhea and chlamydia if: ? You are sexually active and are younger than 43 years of age. ? You are older than 43 years of age and your health care provider tells you that you are at risk for this type of infection. ? Your sexual activity has changed since you were last screened and you are at an increased risk for chlamydia or gonorrhea. Ask your health care provider if you are at risk.  If you do not have HIV, but are at risk, it may be recommended  that you take a prescription medicine daily to prevent HIV infection. This is called pre-exposure prophylaxis (PrEP). You are considered at risk if: ? You are sexually active and do not regularly use condoms or know the HIV status of your partner(s). ? You take drugs by injection. ? You are sexually active with a partner who has HIV.  Talk with your health care provider about whether you are at high risk of  being infected with HIV. If you choose to begin PrEP, you should first be tested for HIV. You should then be tested every 3 months for as long as you are taking PrEP. Pregnancy  If you are premenopausal and you may become pregnant, ask your health care provider about preconception counseling.  If you may become pregnant, take 400 to 800 micrograms (mcg) of folic acid every day.  If you want to prevent pregnancy, talk to your health care provider about birth control (contraception). Osteoporosis and menopause  Osteoporosis is a disease in which the bones lose minerals and strength with aging. This can result in serious bone fractures. Your risk for osteoporosis can be identified using a bone density scan.  If you are 63 years of age or older, or if you are at risk for osteoporosis and fractures, ask your health care provider if you should be screened.  Ask your health care provider whether you should take a calcium or vitamin D supplement to lower your risk for osteoporosis.  Menopause may have certain physical symptoms and risks.  Hormone replacement therapy may reduce some of these symptoms and risks. Talk to your health care provider about whether hormone replacement therapy is right for you. Follow these instructions at home:  Schedule regular health, dental, and eye exams.  Stay current with your immunizations.  Do not use any tobacco products including cigarettes, chewing tobacco, or electronic cigarettes.  If you are pregnant, do not drink alcohol.  If you are  breastfeeding, limit how much and how often you drink alcohol.  Limit alcohol intake to no more than 1 drink per day for nonpregnant women. One drink equals 12 ounces of beer, 5 ounces of wine, or 1 ounces of hard liquor.  Do not use street drugs.  Do not share needles.  Ask your health care provider for help if you need support or information about quitting drugs.  Tell your health care provider if you often feel depressed.  Tell your health care provider if you have ever been abused or do not feel safe at home. This information is not intended to replace advice given to you by your health care provider. Make sure you discuss any questions you have with your health care provider. Document Released: 04/22/2011 Document Revised: 03/14/2016 Document Reviewed: 07/11/2015 Elsevier Interactive Patient Education  [employer].

## 2018-07-23 ENCOUNTER — Encounter: Payer: Self-pay | Admitting: Neurology

## 2018-07-27 DIAGNOSIS — G4761 Periodic limb movement disorder: Secondary | ICD-10-CM | POA: Diagnosis not present

## 2018-07-27 DIAGNOSIS — G47411 Narcolepsy with cataplexy: Secondary | ICD-10-CM | POA: Diagnosis not present

## 2018-07-27 DIAGNOSIS — D508 Other iron deficiency anemias: Secondary | ICD-10-CM | POA: Diagnosis not present

## 2018-08-24 ENCOUNTER — Encounter: Payer: Self-pay | Admitting: Family Medicine

## 2018-08-24 MED ORDER — ALPRAZOLAM 0.25 MG PO TBDP: 0.2500 mg | ORAL_TABLET | Freq: Two times a day (BID) | ORAL | 0 refills | Status: DC | PRN

## 2018-09-22 ENCOUNTER — Ambulatory Visit: Payer: BLUE CROSS/BLUE SHIELD | Admitting: Adult Health

## 2018-10-05 ENCOUNTER — Ambulatory Visit: Payer: BLUE CROSS/BLUE SHIELD | Admitting: Psychology

## 2018-10-05 DIAGNOSIS — F4323 Adjustment disorder with mixed anxiety and depressed mood: Secondary | ICD-10-CM

## 2018-11-03 ENCOUNTER — Encounter: Payer: Self-pay | Admitting: Family Medicine

## 2018-11-03 DIAGNOSIS — Z130 Encounter for screening for diseases of the blood and blood-forming organs and certain disorders involving the immune mechanism: Secondary | ICD-10-CM

## 2018-11-03 DIAGNOSIS — G47411 Narcolepsy with cataplexy: Secondary | ICD-10-CM | POA: Diagnosis not present

## 2018-11-06 ENCOUNTER — Ambulatory Visit: Payer: Self-pay | Admitting: *Deleted

## 2018-11-06 ENCOUNTER — Ambulatory Visit: Payer: Self-pay

## 2018-11-06 NOTE — Telephone Encounter (Signed)
Called pt. Back in follow-up to clinical call with c/o SOB, Fatigue.  Pt. stated she returned call to office, and already spoke with another Triage nurse; has appt. Scheduled 11/07/18.  See Triage note of today at 3:10 PM.

## 2018-11-06 NOTE — Telephone Encounter (Signed)
Pt called to say she has felt SOB and had reflux.  She states that it make her chest feel tight.  She states it does not travel anywhere. She sometimes in her throat it feels thick. She states the symptoms began about a month ago but have gotten worse today. She has felt real fatigued but states she has just been to her sleep doctor.  She has narcolepsy. She denies medication change.  She states she has felt more anxious. Appointment scheduled per protocol. Care advice read to patient. Pt verbalized understanding of all instructions.  Reason for Disposition . [1] Chest pain lasting <= 5 minutes AND [2] NO chest pain or cardiac symptoms now(Exceptions: pains lasting a few seconds)  Answer Assessment - Initial Assessment Questions 1. LOCATION: "Where does it hurt?"       [facility] 2. RADIATION: "Does the pain go anywhere else?" (e.g., into neck, jaw, arms, back)    no 3. ONSET: "When did the chest pain begin?" (Minutes, hours or days)      1 month indigestion getting gradually worse 4. PATTERN "Does the pain come and go, or has it been constant since it started?"  "Does it get worse with exertion?"      Yes worse with exertion 5. DURATION: "How long does it last" (e.g., seconds, minutes, hours)   Indigestion comes and go but fatigue has been worse 6. SEVERITY: "How bad is the pain?"  (e.g., Scale 1-10; mild, moderate, or severe)    - MILD (1-3): doesn't interfere with normal activities     - MODERATE (4-7): interferes with normal activities or awakens from sleep    - SEVERE (8-10): excruciating pain, unable to do any normal activities       4 7. CARDIAC RISK FACTORS: "Do you have any history of heart problems or risk factors for heart disease?" (e.g., prior heart attack, angina; high blood pressure, diabetes, being overweight, high cholesterol, smoking, or strong family history of heart disease)     Family history 8. PULMONARY RISK FACTORS: "Do you have any history of lung disease?"  (e.g.,  blood clots in lung, asthma, emphysema, birth control pills      exercise induced astha 9. CAUSE: "What do you think is causing the chest pain?"     Unsure anxiety or reflux 10. OTHER SYMPTOMS: "Do you have any other symptoms?" (e.g., dizziness, nausea, vomiting, sweating, fever, difficulty breathing, cough)     SOB dizzy when changing positions always had that 11. PREGNANCY: "Is there any chance you are pregnant?" "When was your last menstrual period?"       No husband had operation  Protocols used: CHEST PAIN-A-AH

## 2018-11-06 NOTE — Progress Notes (Signed)
No chief complaint on file.   HPI: Elizabeth [name] 43 y.o. Patient comes in today for [facility] Saturday clinic for  problem evaluation.  2 nurse triage messages yesterday  Has a list of scenario    Has reflux  Worsening  In last month   Hs of ocass heart thumping.   ? From adderall . Which she is on for narcolepsy.   Fatigue ongoing and worse  Has narcolepsy.   Short of breath and has to sit and rest  This week.  tightness  In chest this week   Walikng across room  Noted    Breathlessness Yesterday  Without tachy  Gets ocass palpitations .  No new meds x inc dose of mirapex.   SOB and  And chest pain  And anxious   Seeing counselor    No panic attacks but anxiety issues .  Dr   At  [facility]   Is her Sleep specialist .     ROS: See pertinent positives and negatives per HPI. Fever chills  Lordosis of neck back   Feels like not getting enough iar.   Remote hx of EIA  But no rx. Remote hx of  Walking pna.    No cough fever  Min cough  Periods perimenopausal   Past Medical History:  Diagnosis Date  . Allergy   . Anemia   . Anxiety state 02/15/2016  . Arthritis   . Depression     Family History  Problem Relation Age of Onset  . Heart disease Mother   . Depression Mother   . Anxiety disorder Mother   . Diabetes Father   . Depression Father   . Anxiety disorder Father   . Stroke Maternal Grandmother   . Parkinsonism Paternal Grandmother   . Breast cancer Paternal Grandmother   . Bladder Cancer Paternal Grandfather     Social History   Socioeconomic History  . Marital status: Married    Spouse name: Not on file  . Number of children: 2  . Years of education: Masters  . Highest education level: Not on file  Occupational History  . Not on file  Social Needs  . Financial resource strain: Not on file  . Food insecurity:    Worry: Not on file    Inability: Not on file  . Transportation needs:    Medical: Not on file    Non-medical: Not on file  Tobacco Use  . Smoking  status: Former Smoker    Last attempt to quit: 10/21/2004    Years since quitting: 14.0  . Smokeless tobacco: Never Used  . Tobacco comment: quit 11 yrs ago  Substance and Sexual Activity  . Alcohol use: No    Alcohol/week: 0.0 standard drinks  . Drug use: Not on file  . Sexual activity: Yes    Birth control/protection: Surgical  Lifestyle  . Physical activity:    Days per week: Not on file    Minutes per session: Not on file  . Stress: Not on file  Relationships  . Social connections:    Talks on phone: Not on file    Gets together: Not on file    Attends religious service: Not on file    Active member of club or organization: Not on file    Attends meetings of clubs or organizations: Not on file    Relationship status: Not on file  Other Topics Concern  . Not on file  Social History Narrative   2  caffeine drinks a day     Outpatient Medications Prior to Visit  Medication Sig Dispense Refill  . amphetamine-dextroamphetamine (ADDERALL XR) 20 MG 24 hr capsule Take 1 capsule (20 mg total) by mouth daily. 30 capsule 0  . amphetamine-dextroamphetamine (ADDERALL) 10 MG tablet Take 1 tablet (10 mg total) by mouth daily as needed. 30 tablet 0  . clobetasol (TEMOVATE) 0.05 % GEL Apply to canker sore up to three times a day as needed 15 each 3  . fluticasone (FLONASE) 50 MCG/ACT nasal spray Place 2 sprays into both nostrils daily as needed for allergies or rhinitis.    . meloxicam (MOBIC) 15 MG tablet TAKE 1 TABLET BY MOUTH EVERY DAY 30 tablet 5  . modafinil (PROVIGIL) 100 MG tablet TAKE 2 TABLETS BY MOUTH EVERY MORNING, AND TAKE 1 TABLET AFTER LUNCH NO LATER THAN 3PM (Patient taking differently: TAKE 3 TABLETS BY MOUTH EVERY MORNING, AND TAKE 1 TABLET AFTER LUNCH NO LATER THAN 3PM) 90 tablet 0  . pramipexole (MIRAPEX) 0.25 MG tablet Take by mouth.    . pramipexole (MIRAPEX) 0.5 MG tablet Take by mouth.    . ALPRAZolam (NIRAVAM) 0.25 MG dissolvable tablet Take 1 tablet (0.25 mg total) by  mouth 2 (two) times daily as needed for anxiety. 20 tablet 0   No facility-administered medications prior to visit.      EXAM:  BP 116/74 (BP Location: Left Arm, Patient Position: Sitting, Cuff Size: Normal)   Pulse 82   Temp 98.3 F (36.8 C) (Oral)   Ht 5\' 5"  (1.651 m)   Wt 120 lb (54.4 kg)   SpO2 99%   BMI 19.97 kg/m   Body mass index is 19.97 kg/m.  GENERAL: vitals reviewed and listed above, alert, oriented, appears well hydrated and in no acute distress HEENT: atraumatic, conjunctiva  clear, no obvious abnormalities on inspection of external nose and ears tms clear OP : no lesion edema or exudate  NECK: no obvious masses on inspection palpation  LUNGS: clear to auscultation bilaterally, no wheezes, rales or rhonchi, good air movement CV: HRRR, no clubbing cyanosis or  peripheral edema nl cap refill  Abdomen:  Sof,t normal bowel sounds without hepatosplenomegaly, no guarding rebound or masses no CVA tenderness  MS: moves all extremities without noticeable focal  Abnormality NEURO: oriented x 3 CN 3-12 appear intact. No focal muscle weakness or atrophy. Gait WNL.  Grossly non focal. No tremor or abnormal movement. PSYCH: pleasant and cooperative, no obvious depression mildy anxious  Lab Results  Component Value Date   WBC 6.2 07/22/2018   HGB 12.4 07/22/2018   HCT 36.3 07/22/2018   PLT 266.0 07/22/2018   GLUCOSE 95 07/22/2018   CHOL 163 07/22/2018   TRIG 54.0 07/22/2018   HDL 70.10 07/22/2018   LDLCALC 82 07/22/2018   ALT 9 07/22/2018   AST 13 07/22/2018   NA 137 07/22/2018   K 3.8 07/22/2018   CL 101 07/22/2018   CREATININE 0.69 07/22/2018   BUN 8 07/22/2018   CO2 31 07/22/2018   TSH 1.33 02/06/2017   HGBA1C 5.3 07/22/2018   BP Readings from Last 3 Encounters:  11/07/18 116/74  07/22/18 118/70  06/18/18 130/80  EKG nsr no acute findings   ASSESSMENT AND PLAN:  Discussed the following assessment and plan:  Dyspnea, unspecified type - Plan: Basic  metabolic panel, CBC with Differential/Platelet, Hepatic function panel, TSH, T4, free, Iron, TIBC and Ferritin Panel, EKG 12-Lead, DG Chest 2 View  Medication management - Plan:  Basic metabolic panel, CBC with Differential/Platelet, Hepatic function panel, TSH, T4, free, Iron, TIBC and Ferritin Panel  Burping - can try adding omeprezole for  short time and fu with PCP to advise  this could be functional   Primary narcolepsy   High risk medication use DOE  and fatigue  On many meds  Exam is reassuring   And normal  But needs further evaluation.  That  Is limited to be done in  Saturday clinic.   Agree with  Anxiety  Counseling     Lab and  X ay ordered at med center  ?  -Patient advised to return or notify health care team  if  new concerns arise.  Patient Instructions  Exam is reassuring and   Oxygen level is normal as well as ekg   Order chest x ray   Also at med center  Lab work at     Med center   And plan fu appt with your pcp to assess.  Try omeprazole  Every day  For reflux  Burping   Once a day  For no more than 2 weeks   . And  Then fu with PCP.     Standley Brooking. Kayden Amend M.D.

## 2018-11-06 NOTE — Telephone Encounter (Signed)
Summary: SOB fatigue    Patient called with complaints of shortness of breath for "about a week" and fatigue that she feels is getting worse. Patient disconnected call while waiting for triage.      Left message for patient to call back

## 2018-11-07 ENCOUNTER — Encounter: Payer: Self-pay | Admitting: Internal Medicine

## 2018-11-07 ENCOUNTER — Ambulatory Visit: Payer: BLUE CROSS/BLUE SHIELD | Admitting: Internal Medicine

## 2018-11-07 VITALS — BP 116/74 | HR 82 | Temp 98.3°F | Ht 65.0 in | Wt 120.0 lb

## 2018-11-07 DIAGNOSIS — R142 Eructation: Secondary | ICD-10-CM

## 2018-11-07 DIAGNOSIS — G47419 Narcolepsy without cataplexy: Secondary | ICD-10-CM

## 2018-11-07 DIAGNOSIS — R0600 Dyspnea, unspecified: Secondary | ICD-10-CM

## 2018-11-07 DIAGNOSIS — Z79899 Other long term (current) drug therapy: Secondary | ICD-10-CM | POA: Diagnosis not present

## 2018-11-07 NOTE — Patient Instructions (Addendum)
Exam is reassuring and   Oxygen level is normal as well as ekg   Order chest x ray   Also at med center  Lab work at     Med center   And plan fu appt with your pcp to assess.  Try omeprazole  Every day  For reflux  Burping   Once a day  For no more than 2 weeks   . And  Then fu with PCP.

## 2018-11-10 ENCOUNTER — Ambulatory Visit (HOSPITAL_BASED_OUTPATIENT_CLINIC_OR_DEPARTMENT_OTHER)
Admission: RE | Admit: 2018-11-10 | Discharge: 2018-11-10 | Disposition: A | Discharge status: Home / Self Care | Payer: BLUE CROSS/BLUE SHIELD | Source: Ambulatory Visit | Attending: Internal Medicine | Admitting: Internal Medicine

## 2018-11-10 ENCOUNTER — Other Ambulatory Visit (INDEPENDENT_AMBULATORY_CARE_PROVIDER_SITE_OTHER): Payer: BLUE CROSS/BLUE SHIELD

## 2018-11-10 ENCOUNTER — Ambulatory Visit: Payer: BLUE CROSS/BLUE SHIELD | Admitting: Psychology

## 2018-11-10 DIAGNOSIS — R0602 Shortness of breath: Secondary | ICD-10-CM | POA: Diagnosis not present

## 2018-11-10 DIAGNOSIS — R0600 Dyspnea, unspecified: Secondary | ICD-10-CM

## 2018-11-10 DIAGNOSIS — F4323 Adjustment disorder with mixed anxiety and depressed mood: Secondary | ICD-10-CM

## 2018-11-10 DIAGNOSIS — Z79899 Other long term (current) drug therapy: Secondary | ICD-10-CM | POA: Diagnosis not present

## 2018-11-10 LAB — BASIC METABOLIC PANEL
BUN: 11 mg/dL (ref 6–23)
CALCIUM: 9.4 mg/dL (ref 8.4–10.5)
CO2: 32 mEq/L (ref 19–32)
Chloride: 102 mEq/L (ref 96–112)
Creatinine, Ser: 0.66 mg/dL (ref 0.40–1.20)
GFR: 97.42 mL/min (ref >60.00)
Glucose, Bld: 81 mg/dL (ref 70–99)
Potassium: 4.7 mEq/L (ref 3.5–5.1)
SODIUM: 139 meq/L (ref 135–145)

## 2018-11-10 LAB — CBC WITH DIFFERENTIAL/PLATELET
Basophils Absolute: 0 10*3/uL (ref 0.0–0.1)
Basophils Relative: 0.6 % (ref 0.0–3.0)
EOS ABS: 0.1 10*3/uL (ref 0.0–0.7)
Eosinophils Relative: 1.4 % (ref 0.0–5.0)
HCT: 38.8 % (ref 36.0–46.0)
Hemoglobin: 13 g/dL (ref 12.0–15.0)
Lymphocytes Relative: 26.8 % (ref 12.0–46.0)
Lymphs Abs: 1.2 10*3/uL (ref 0.7–4.0)
MCHC: 33.6 g/dL (ref 30.0–36.0)
MCV: 91.1 fl (ref 78.0–100.0)
Monocytes Absolute: 0.3 10*3/uL (ref 0.1–1.0)
Monocytes Relative: 7.5 % (ref 3.0–12.0)
Neutro Abs: 2.8 10*3/uL (ref 1.4–7.7)
Neutrophils Relative %: 63.7 % (ref 43.0–77.0)
Platelets: 302 10*3/uL (ref 150.0–400.0)
RBC: 4.25 Mil/uL (ref 3.87–5.11)
RDW: 13.2 % (ref 11.5–15.5)
WBC: 4.4 10*3/uL (ref 4.0–10.5)

## 2018-11-10 LAB — HEPATIC FUNCTION PANEL
ALT: 9 U/L (ref 0–35)
AST: 14 U/L (ref 0–37)
Albumin: 4.6 g/dL (ref 3.5–5.2)
Alkaline Phosphatase: 48 U/L (ref 39–117)
BILIRUBIN TOTAL: 0.3 mg/dL (ref 0.2–1.2)
Bilirubin, Direct: 0.1 mg/dL (ref 0.0–0.3)
Total Protein: 7.3 g/dL (ref 6.0–8.3)

## 2018-11-10 LAB — TSH: TSH: 1.92 u[IU]/mL (ref 0.35–4.50)

## 2018-11-10 LAB — T4, FREE: Free T4: 0.84 ng/dL (ref 0.60–1.60)

## 2018-11-11 LAB — IRON,TIBC AND FERRITIN PANEL
%SAT: 37 % (calc) (ref 16–45)
Ferritin: 47 ng/mL (ref 16–232)
Iron: 126 ug/dL (ref 40–190)
TIBC: 343 mcg/dL (calc) (ref 250–450)

## 2018-11-23 ENCOUNTER — Encounter: Payer: Self-pay | Admitting: Family Medicine

## 2018-11-24 ENCOUNTER — Ambulatory Visit: Payer: BLUE CROSS/BLUE SHIELD | Admitting: Psychology

## 2018-12-01 ENCOUNTER — Encounter: Payer: Self-pay | Admitting: Family Medicine

## 2018-12-02 ENCOUNTER — Encounter: Payer: Self-pay | Admitting: Family Medicine

## 2018-12-02 MED ORDER — ALPRAZOLAM 0.25 MG PO TBDP: 0.2500 mg | ORAL_TABLET | Freq: Two times a day (BID) | ORAL | 0 refills | Status: DC | PRN

## 2018-12-06 ENCOUNTER — Encounter: Payer: Self-pay | Admitting: Family Medicine

## 2018-12-08 ENCOUNTER — Ambulatory Visit: Payer: BLUE CROSS/BLUE SHIELD | Admitting: Psychology

## 2018-12-10 ENCOUNTER — Encounter: Payer: Self-pay | Admitting: Allergy & Immunology

## 2018-12-10 ENCOUNTER — Ambulatory Visit: Payer: BLUE CROSS/BLUE SHIELD | Admitting: Allergy & Immunology

## 2018-12-10 VITALS — BP 100/64 | HR 85 | Temp 98.0°F | Resp 16 | Ht 66.0 in | Wt 120.0 lb

## 2018-12-10 DIAGNOSIS — R0602 Shortness of breath: Secondary | ICD-10-CM | POA: Diagnosis not present

## 2018-12-10 DIAGNOSIS — J310 Chronic rhinitis: Secondary | ICD-10-CM

## 2018-12-10 NOTE — Progress Notes (Signed)
NEW PATIENT  Date of Service/Encounter:  12/10/18  Referring provider: SELF REFERRED   Assessment:   SOB (shortness of breath)  Chronic rhinitis   Plan/Recommendations:   1. SOB (shortness of breath) -Lung testing look completely normal today. -Most of the time, we diagnosed asthma based on a combination of history as well as spirometry. -The definitive way to diagnose it, however, is to do a methacholine challenge. -However, with your history of anxiety, I do not think this would be an enjoyable experience. -In the interim, I think we can answer it by treating you tentatively as asthmatic with Breo 1 puff once daily. -Breo contains a long-acting albuterol combined with a long-acting inhaled steroid. -It should be able to help you have increased endurance and decreased episodes of the shortness of breath with physical activity. -If there is no improvement with the use of this inhaler, I think this would point away from a diagnosis of asthma.  2. Chronic rhinitis -Your histamine control was minimally reactive today. -This is likely because you took NyQuil last night, which does contain an antihistamine. -We will plan to do testing in 2 weeks when you return after using the [facility] for a couple of weeks. -The testing might elucidate any triggers for your symptoms.  3. Return in about 2 weeks (around 12/24/2018).  Subjective:   Elizabeth [name] is a 43 y.o. female presenting today for evaluation of  Chief Complaint  Patient presents with  . Asthma  . Sinus Problem    ST, cough, drainage, fatigue     Elizabeth [name] has a history of the following: Patient Active Problem List   Diagnosis Date Noted  . Metatarsalgia of right foot 09/20/2016  . Anxiety state 02/15/2016    History obtained from: chart review and patient.  [facility] was referred by Copland, Gay Filler, MD.     Elizabeth [name] is a 43 y.o. female presenting for an evaluation of shortness of breath.     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  However, she never really had an inhaler.  She had a work-up performed at that time.  She presents today due to shortness of breath for the last 2 months or so.  She notices the shortness of breath when she has increased activity, such as going up stairs.  Her history is somewhat obscured by her known history of narcolepsy and restless leg syndrome.  Therefore, she never really gets good sleep, so she has always attributed the fatigue to this.  In any case, this shortness of breath seems to get better after stopping her activity.  She has had a work-up performed at an urgent care facility within the last 2 months.  She tells me that the chest x-ray was within normal limits and an EKG was within normal limits.  She has no history of blood clots in the family.  No one ever talked to her about the possibility of blood clots.  She certainly has no history of pulmonary embolisms.  She does not have any risk factors of this either.  She did smoke in the distant past, but stopped smoking in 2005.  She was also exposed to secondhand smoke as a child as her father smoked.  Allergic Rhinitis Symptom History: She does report a history of allergic rhinitis symptoms.  This seems to be worse in the fall.  She occasionally will use some over-the-counter antihistamines, but does not use anything regularly.  She does  get sinusitis around 1 time per year.  Food Allergy Symptom History: She tolerates all of the major food allergens without adverse event.  However, she does report that she has indigestion with apples, raspberries, and blackberries.  She has never been tested for these at all.  She denies any hives, throat swelling, or other histaminergic responses.  She does have a history of anxiety.  She has been on anxiety meds during different points in her life.  She stopped taking her anxiety meds in  2007 when she had her sleep study.  During the sleep study, she was diagnosed with narcolepsy.  However, after the sleep study, she noticed that she felt better and never started her anxiety meds again.  Since her shortness of breath returned, she does think that this might be related.  She is open to restarting anxiety meds if needed.  She also has a history of restless leg syndrome and is on Mirapex for this.  Otherwise, there is no history of other atopic diseases, including food allergies, drug allergies, stinging insect allergies, eczema, urticaria or contact dermatitis. There is no significant infectious history. Vaccinations are up to date.    Past Medical History: Patient Active Problem List   Diagnosis Date Noted  . Metatarsalgia of right foot 09/20/2016  . Anxiety state 02/15/2016    Medication List:  Allergies as of 12/10/2018      Reactions   Sulfa Antibiotics       Medication List       Accurate as of December 10, 2018  9:22 AM. Always use your most recent med list.        ALPRAZolam 0.25 MG dissolvable tablet Commonly known as:  NIRAVAM Take 1 tablet (0.25 mg total) by mouth 2 (two) times daily as needed for anxiety.   cholecalciferol 25 MCG (1000 UT) tablet Commonly known as:  VITAMIN D3 Take 1,000 Units by mouth daily.   clobetasol 0.05 % Gel Commonly known as:  TEMOVATE Apply to canker sore up to three times a day as needed   ferrous sulfate 325 (65 FE) MG EC tablet Take 325 mg by mouth 3 (three) times daily with meals.   fluticasone 50 MCG/ACT nasal spray Commonly known as:  FLONASE Place 2 sprays into both nostrils daily as needed for allergies or rhinitis.   meloxicam 15 MG tablet Commonly known as:  MOBIC TAKE 1 TABLET BY MOUTH EVERY DAY   modafinil 100 MG tablet Commonly known as:  PROVIGIL TAKE 2 TABLETS BY MOUTH EVERY MORNING, AND TAKE 1 TABLET AFTER LUNCH NO LATER THAN 3PM   multivitamin with minerals Tabs tablet Take 1 tablet by mouth  daily.   pramipexole 0.5 MG tablet Commonly known as:  MIRAPEX Take by mouth.       Birth History: non-contributory  Developmental History: non-contributory.   Past Surgical History: Past Surgical History:  Procedure Laterality Date  . CESAREAN SECTION     X2  . DILATION AND CURETTAGE OF UTERUS    . NASAL SEPTUM SURGERY    . ROOT CANAL     x 2     Family History: Family History  Problem Relation Age of Onset  . Heart disease Mother   . Depression Mother   . Anxiety disorder Mother   . Diabetes Father   . Depression Father   . Anxiety disorder Father   . Stroke Maternal Grandmother   . Parkinsonism Paternal Grandmother   . Breast cancer Paternal Grandmother   .  Bladder Cancer Paternal Grandfather      Social History: Elizabeth lives at home with husband and 2 daughters.  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  They live in a house that is 68 years old.  There is wood flooring throughout the home.  They have electric heating and central cooling.  There are 2 dogs in the home.  There are no dust mite covers on the bedding.  There is no current tobacco exposure.   Review of Systems  Constitutional: Negative.  Negative for fever, malaise/fatigue and weight loss.  HENT: Negative.  Negative for congestion, ear discharge and ear pain.   Eyes: Negative for pain, discharge and redness.  Respiratory: Positive for shortness of breath. Negative for cough, sputum production and wheezing.   Cardiovascular: Negative.  Negative for chest pain and palpitations.  Gastrointestinal: Negative for abdominal pain and heartburn.  Musculoskeletal:       Positive for history of restless leg syndrome  Skin: Negative.  Negative for itching and rash.  Neurological: Negative for dizziness and headaches.  Endo/Heme/Allergies: Negative for environmental allergies. Does not bruise/bleed easily.  Psychiatric/Behavioral: The patient is nervous/anxious.         Objective:   Blood pressure 100/64, pulse 85, temperature 98 F (36.7 C), temperature source Oral, resp. rate 16, height 5\' 6"  (1.676 m), weight 120 lb (54.4 kg), SpO2 98 %. Body mass index is 19.37 kg/m.   Physical Exam:   Physical Exam  Constitutional: She appears well-developed.  HENT:  Head: Normocephalic and atraumatic.  Right Ear: Tympanic membrane, external ear and ear canal normal. No drainage, swelling or tenderness. Tympanic membrane is not injected, not scarred, not erythematous, not retracted and not bulging.  Left Ear: Tympanic membrane, external ear and ear canal normal. No drainage, swelling or tenderness. Tympanic membrane is not injected, not scarred, not erythematous, not retracted and not bulging.  Nose: No mucosal edema, rhinorrhea, nasal deformity or septal deviation. No epistaxis. Right sinus exhibits no maxillary sinus tenderness and no frontal sinus tenderness. Left sinus exhibits no maxillary sinus tenderness and no frontal sinus tenderness.  Mouth/Throat: Uvula is midline and oropharynx is clear and moist. Mucous membranes are not pale and not dry.  Eyes: Pupils are equal, round, and reactive to light. Conjunctivae and EOM are normal. Right eye exhibits no chemosis and no discharge. Left eye exhibits no chemosis and no discharge. Right conjunctiva is not injected. Left conjunctiva is not injected.  Cardiovascular: Normal rate, regular rhythm and normal heart sounds.  Respiratory: Effort normal and breath sounds normal. No accessory muscle usage. No tachypnea. No respiratory distress. She has no wheezes. She has no rhonchi. She has no rales. She exhibits no tenderness.  GI: There is no abdominal tenderness. There is no rebound and no guarding.  Lymphadenopathy:       Head (right side): No submandibular, no tonsillar and no occipital adenopathy present.       Head (left side): No submandibular, no tonsillar and no occipital adenopathy present.    She has  no cervical adenopathy.  Neurological: She is alert.  Skin: No abrasion, no petechiae and no rash noted. Rash is not papular, not vesicular and not urticarial. No erythema. No pallor.  Psychiatric: She has a normal mood and affect.     Diagnostic studies:    Spirometry: results normal (FEV1: 3.11/98%, FVC: 3.98/102%, FEV1/FVC: 78%).    Spirometry consistent with normal pattern.   Allergy Studies: histamine was non-reactive  Allergy testing results were read and interpreted by myself, documented by clinical staff.       Salvatore Marvel, MD [facility]

## 2018-12-10 NOTE — Patient Instructions (Addendum)
1. SOB (shortness of breath) -Lung testing look completely normal today. -Most of the time, we diagnosed asthma based on a combination of history as well as spirometry. -The definitive way to diagnose it, however, is to do a methacholine challenge. -However, with your history of anxiety, I do not think this would be an enjoyable experience. -In the interim, I think we can answer it by treating you tentatively as asthmatic with Breo 1 puff once daily. -Breo contains a long-acting albuterol combined with a long-acting inhaled steroid. -It should be able to help you have increased endurance and decreased episodes of the shortness of breath with physical activity. -If there is no improvement with the use of this inhaler, I think this would point away from a diagnosis of asthma.  2. Chronic rhinitis -Your histamine control was minimally reactive today. -This is likely because you took NyQuil last night, which does contain an antihistamine. -We will plan to do testing in 2 weeks when you return after using the [facility] for a couple of weeks. -The testing might elucidate any triggers for your symptoms.  3. Return in about 2 weeks (around 12/24/2018).   Please inform us of any Emergency Department visits, hospitalizations, or changes in symptoms. Call us before going to the ED for breathing or allergy symptoms since we might be able to fit you in for a sick visit. Feel free to contact us anytime with any questions, problems, or concerns.  It was a pleasure to see you again today!  Websites that have reliable patient information: 1. American Academy of Asthma, Allergy, and Immunology: www.aaaai.org 2. Food Allergy Research and Education (FARE): foodallergy.org 3. Mothers of Asthmatics: http://www.asthmacommunitynetwork.org 4. American College of Allergy, Asthma, and Immunology: MonthlyElectricBill.co.uk   Make sure you are registered to vote! If you have moved or changed any of your contact information, you will  need to get this updated before voting!    Voter ID laws are NOT going into effect for the General Election in November 2020! DO NOT let this stop you from exercising your right to vote!

## 2018-12-13 MED ORDER — BUPROPION HCL ER (XL) 150 MG PO TB24: 150.0000 mg | ORAL_TABLET | Freq: Every day | ORAL | 3 refills | Status: DC

## 2018-12-13 NOTE — Addendum Note (Signed)
Addended by: Lamar Blinks C on: 12/13/2018 03:09 PM   Modules accepted: Orders

## 2018-12-22 ENCOUNTER — Encounter: Payer: Self-pay | Admitting: Family Medicine

## 2018-12-23 ENCOUNTER — Encounter: Payer: Self-pay | Admitting: Family Medicine

## 2018-12-23 DIAGNOSIS — F411 Generalized anxiety disorder: Secondary | ICD-10-CM

## 2018-12-25 MED ORDER — ALPRAZOLAM 0.25 MG PO TBDP: 0.2500 mg | ORAL_TABLET | Freq: Two times a day (BID) | ORAL | 0 refills | Status: DC | PRN

## 2018-12-25 NOTE — Addendum Note (Signed)
Addended by: Lamar Blinks C on: 12/25/2018 05:33 PM   Modules accepted: Orders

## 2018-12-28 ENCOUNTER — Encounter: Payer: Self-pay | Admitting: Allergy & Immunology

## 2018-12-28 MED ORDER — ALPRAZOLAM 0.25 MG PO TBDP: 0.2500 mg | ORAL_TABLET | Freq: Every evening | ORAL | 1 refills | Status: DC | PRN

## 2018-12-28 NOTE — Addendum Note (Signed)
Addended by: Lamar Blinks C on: 12/28/2018 07:05 PM   Modules accepted: Orders

## 2019-01-08 ENCOUNTER — Encounter: Payer: Self-pay | Admitting: Family Medicine

## 2019-01-19 ENCOUNTER — Ambulatory Visit: Payer: BLUE CROSS/BLUE SHIELD | Admitting: Allergy & Immunology

## 2019-02-07 ENCOUNTER — Encounter: Payer: Self-pay | Admitting: Family Medicine

## 2019-02-07 MED ORDER — MELOXICAM 15 MG PO TABS: 15.0000 mg | ORAL_TABLET | Freq: Every day | ORAL | 5 refills | Status: DC

## 2019-02-22 ENCOUNTER — Encounter: Payer: Self-pay | Admitting: Family Medicine

## 2019-03-02 ENCOUNTER — Encounter: Payer: Self-pay | Admitting: Family Medicine

## 2019-03-04 ENCOUNTER — Encounter: Payer: Self-pay | Admitting: Family Medicine

## 2019-03-04 ENCOUNTER — Other Ambulatory Visit: Payer: Self-pay

## 2019-03-04 ENCOUNTER — Ambulatory Visit (INDEPENDENT_AMBULATORY_CARE_PROVIDER_SITE_OTHER): Payer: BLUE CROSS/BLUE SHIELD | Admitting: Family Medicine

## 2019-03-04 DIAGNOSIS — F411 Generalized anxiety disorder: Secondary | ICD-10-CM | POA: Diagnosis not present

## 2019-03-04 MED ORDER — ESCITALOPRAM OXALATE 10 MG PO TABS: 10.0000 mg | ORAL_TABLET | Freq: Every day | ORAL | 1 refills | Status: DC

## 2019-03-04 NOTE — Patient Instructions (Addendum)
It was great to talk to you today, I am sorry you are having a hard time right now.  We are certainly in a uniquely stressful time.  As we discussed, try to have patience with yourself as you manage your increased anxiety.  Please taper off Wellbutrin by taking it every other day for about 1 week.  You can then start back on Lexapro- let's start at 10 mg, we an increase to 20 if need be   Please plan to touch base with me in 4 to 6 weeks.  We can do another virtual visit, unless you would prefer to come in  Of course let me know sooner if you are getting worse, or have any other concerns

## 2019-03-04 NOTE — Progress Notes (Signed)
[place] [facility] [name], [place], [place] [address] [phone]  Date:  03/04/2019   Name:  Elizabeth [name]   DOB:  02-14-1976   MRN:  [id number]  PCP:  Elizabeth [name]    Chief Complaint: No chief complaint on file.   History of Present Illness:  Elizabeth [name] is a 43 y.o. very pleasant female patient who presents with the following:  Virtual visit today due to pandemic Patient location is home, provider location is office Pt ID confirmed with 2 identifiers, she gives consent for virtual visit today   [name] has history of anxiety, depression, narcolepsy  She sent the following my chart message recently, so we set up this visit I would like to try Lexapro again. I was on 20mg  before I had to go off of it for my sleep test.  The twice a day Welbutrin dose made me too spacey and sleepy. The once a day isn't doing much for sadness . Although I know we are under extreme conditions as humans.  My anxiety has definitely gone up considerably and I don't want to take xanax during the day.  Can you give me a plan to wean off welbutrin and build up lexapro? I know it will flatten me out but at this point I need it.  Also, can you order bloodwork to check hormone levels. I'm going through perimenopaus and there are waves at times of extreme feelings of sadness and frustration at myself that come unexpectantly. Due to my having narcolepsy, I make a lot of mistakes and that's where frustration comes in. Thank you and I pray your family is safe and healthy  Today Elizabeth [name] notes that she has had anxiety really as long as she can remember. She strated on lexapro in 2015 while in [place]- they moved to this area, she stopped using lexapro so she could do her sleep study in 7/18.  She seemed to do ok without the lexapro for a while We eventually started her on Wellbutrin, and then she increased to 150 BID- this caused side  effects, she reduced again to 1 pill daily She notes that she tends to feel anxious, gets scared while driving and worried that people might harm her-she is not delusional, she is being paranoid She will tend to have negative thoughts about herself and situation She might make "mistakes" like being irritable, and this makes her really upset with herself She does not want to use xanax during the day, but she gets really nervous and has an urge to take 1  She has seen psychology but not psychiatry in the past  She wonders if she is menopausal-she was noted to have an elevated Elizabeth [name] in the past.  This was checked by gynecology Her menses occur every 1-2 months  She is trying to get outside and work in her garden She is sleeping ok at night   She is not having any self harm ideation  Current medications are Xanax 0.251-2 at bedtime Wellbutrin XL 150 daily Provigil- from Elizabeth [name].  She is taking 300 mg in the am and 1 later in the afternoon Mirapex 0.5 at bedtime Her narcolepsy is treated with Provigil by a sleep medicine specialist, Elizabeth [name] with [facility].  She was diagnosed with narcolepsy in July 2018 on a sleep latency test. I reviewed her most recent narcolepsy note, from January of this year  Her 2 school  age daughters are overall doing okay but missing school and activities, and friends  Patient Active Problem List   Diagnosis Date Noted  . Metatarsalgia of right foot 09/20/2016  . Anxiety state 02/15/2016    Past Medical History:  Diagnosis Date  . Allergy   . Anemia   . Anxiety state 02/15/2016  . Arthritis   . Depression   . Narcolepsy   . PLMD (periodic limb movement disorder)     Past Surgical History:  Procedure Laterality Date  . CESAREAN SECTION     X2  . DILATION AND CURETTAGE OF UTERUS    . NASAL SEPTUM SURGERY    . ROOT CANAL     x 2    Social History   Tobacco Use  . Smoking status: Former Smoker    Last attempt to quit: 10/21/2004     Years since quitting: 14.3  . Smokeless tobacco: Never Used  . Tobacco comment: quit 11 yrs ago  Substance Use Topics  . Alcohol use: No    Alcohol/week: 0.0 standard drinks  . Drug use: Never    Family History  Problem Relation Age of Onset  . Heart disease Mother   . Depression Mother   . Anxiety disorder Mother   . Diabetes Father   . Depression Father   . Anxiety disorder Father   . Stroke Maternal Grandmother   . Parkinsonism Paternal Grandmother   . Breast cancer Paternal Grandmother   . Bladder Cancer Paternal Grandfather     Allergies  Allergen Reactions  . Sulfa Antibiotics     Medication list has been reviewed and updated.  Current Outpatient Medications on File Prior to Visit  Medication Sig Dispense Refill  . ALPRAZolam (NIRAVAM) 0.25 MG dissolvable tablet Take 1-2 tablets (0.25-0.5 mg total) by mouth at bedtime as needed for anxiety. 45 tablet 1  . buPROPion (WELLBUTRIN XL) 150 MG 24 hr tablet Take 1 tablet (150 mg total) by mouth daily. 90 tablet 3  . cholecalciferol (VITAMIN D3) 25 MCG (1000 UT) tablet Take 1,000 Units by mouth daily.    . clobetasol (TEMOVATE) 0.05 % GEL Apply to canker sore up to three times a day as needed 15 each 3  . ferrous sulfate 325 (65 FE) MG EC tablet Take 325 mg by mouth 3 (three) times daily with meals.    . fluticasone (FLONASE) 50 MCG/ACT nasal spray Place 2 sprays into both nostrils daily as needed for allergies or rhinitis.    . meloxicam (MOBIC) 15 MG tablet Take 1 tablet (15 mg total) by mouth daily. As needed for pain 30 tablet 5  . modafinil (PROVIGIL) 100 MG tablet TAKE 2 TABLETS BY MOUTH EVERY MORNING, AND TAKE 1 TABLET AFTER LUNCH NO LATER THAN 3PM (Patient taking differently: TAKE 3 TABLETS BY MOUTH EVERY MORNING, AND TAKE 1 TABLET AFTER LUNCH NO LATER THAN 3PM) 90 tablet 0  . Multiple Vitamin (MULTIVITAMIN WITH MINERALS) TABS tablet Take 1 tablet by mouth daily.    . pramipexole (MIRAPEX) 0.5 MG tablet Take by mouth.      No current facility-administered medications on file prior to visit.     Review of Systems:  No fever or chills   Physical Examination: There were no vitals filed for this visit. There were no vitals filed for this visit. There is no height or weight on file to calculate BMI. Ideal Body Weight:    Pt observed on video today She looks well, her normal self No cough  or distress noted She is not checking vital signs   Assessment and Plan: Anxiety state - Plan: escitalopram (LEXAPRO) 10 MG tablet  Gust her anxiety and mood symptoms in detail today.  Liliane has suffered from anxiety and some depression for really a long time.  She also does have narcolepsy, is treated with Provigil.  She is currently taking Wellbutrin 150 once a day, but notes her anxiety is getting worse.  She would like to go back on Lexapro which she is successfully in the past  We discussed a plan to have her taper off Wellbutrin, and start on Lexapro Pharm- [facility] [address], [name]  Patient sent the following my chart message: t was great to talk to you today, I am sorry you are having a hard time right now.  We are certainly in a uniquely stressful time.  As we discussed, try to have patience with yourself as you manage your increased anxiety.  Please taper off Wellbutrin by taking it every other day for about 1 week.  You can then start back on Lexapro- let's start at 10 mg, we an increase to 20 if need be   Please plan to touch base with me in 4 to 6 weeks.  We can do another virtual visit, unless you would prefer to come in  Of course let me know sooner if you are getting worse, or have any other concerns

## 2019-03-12 ENCOUNTER — Encounter: Payer: Self-pay | Admitting: Family Medicine

## 2019-03-12 DIAGNOSIS — L659 Nonscarring hair loss, unspecified: Secondary | ICD-10-CM

## 2019-03-30 ENCOUNTER — Encounter: Payer: Self-pay | Admitting: Family Medicine

## 2019-03-30 DIAGNOSIS — F411 Generalized anxiety disorder: Secondary | ICD-10-CM

## 2019-03-30 MED ORDER — ESCITALOPRAM OXALATE 20 MG PO TABS: 20.0000 mg | ORAL_TABLET | Freq: Every day | ORAL | 3 refills | Status: DC

## 2019-04-06 ENCOUNTER — Encounter: Payer: Self-pay | Admitting: Family Medicine

## 2019-04-07 ENCOUNTER — Other Ambulatory Visit: Payer: Self-pay | Admitting: Family Medicine

## 2019-04-07 DIAGNOSIS — F411 Generalized anxiety disorder: Secondary | ICD-10-CM

## 2019-04-21 ENCOUNTER — Encounter: Payer: Self-pay | Admitting: Family Medicine

## 2019-05-05 ENCOUNTER — Encounter: Payer: Self-pay | Admitting: Family Medicine

## 2019-05-05 IMAGING — DX DG CHEST 2V
2 series · 2 of 2 positions shown · non-contrast
Comparison: None.

CLINICAL DATA: Shortness of breath for 2 weeks

EXAM:
CHEST - 2 VIEW

[chest pa]
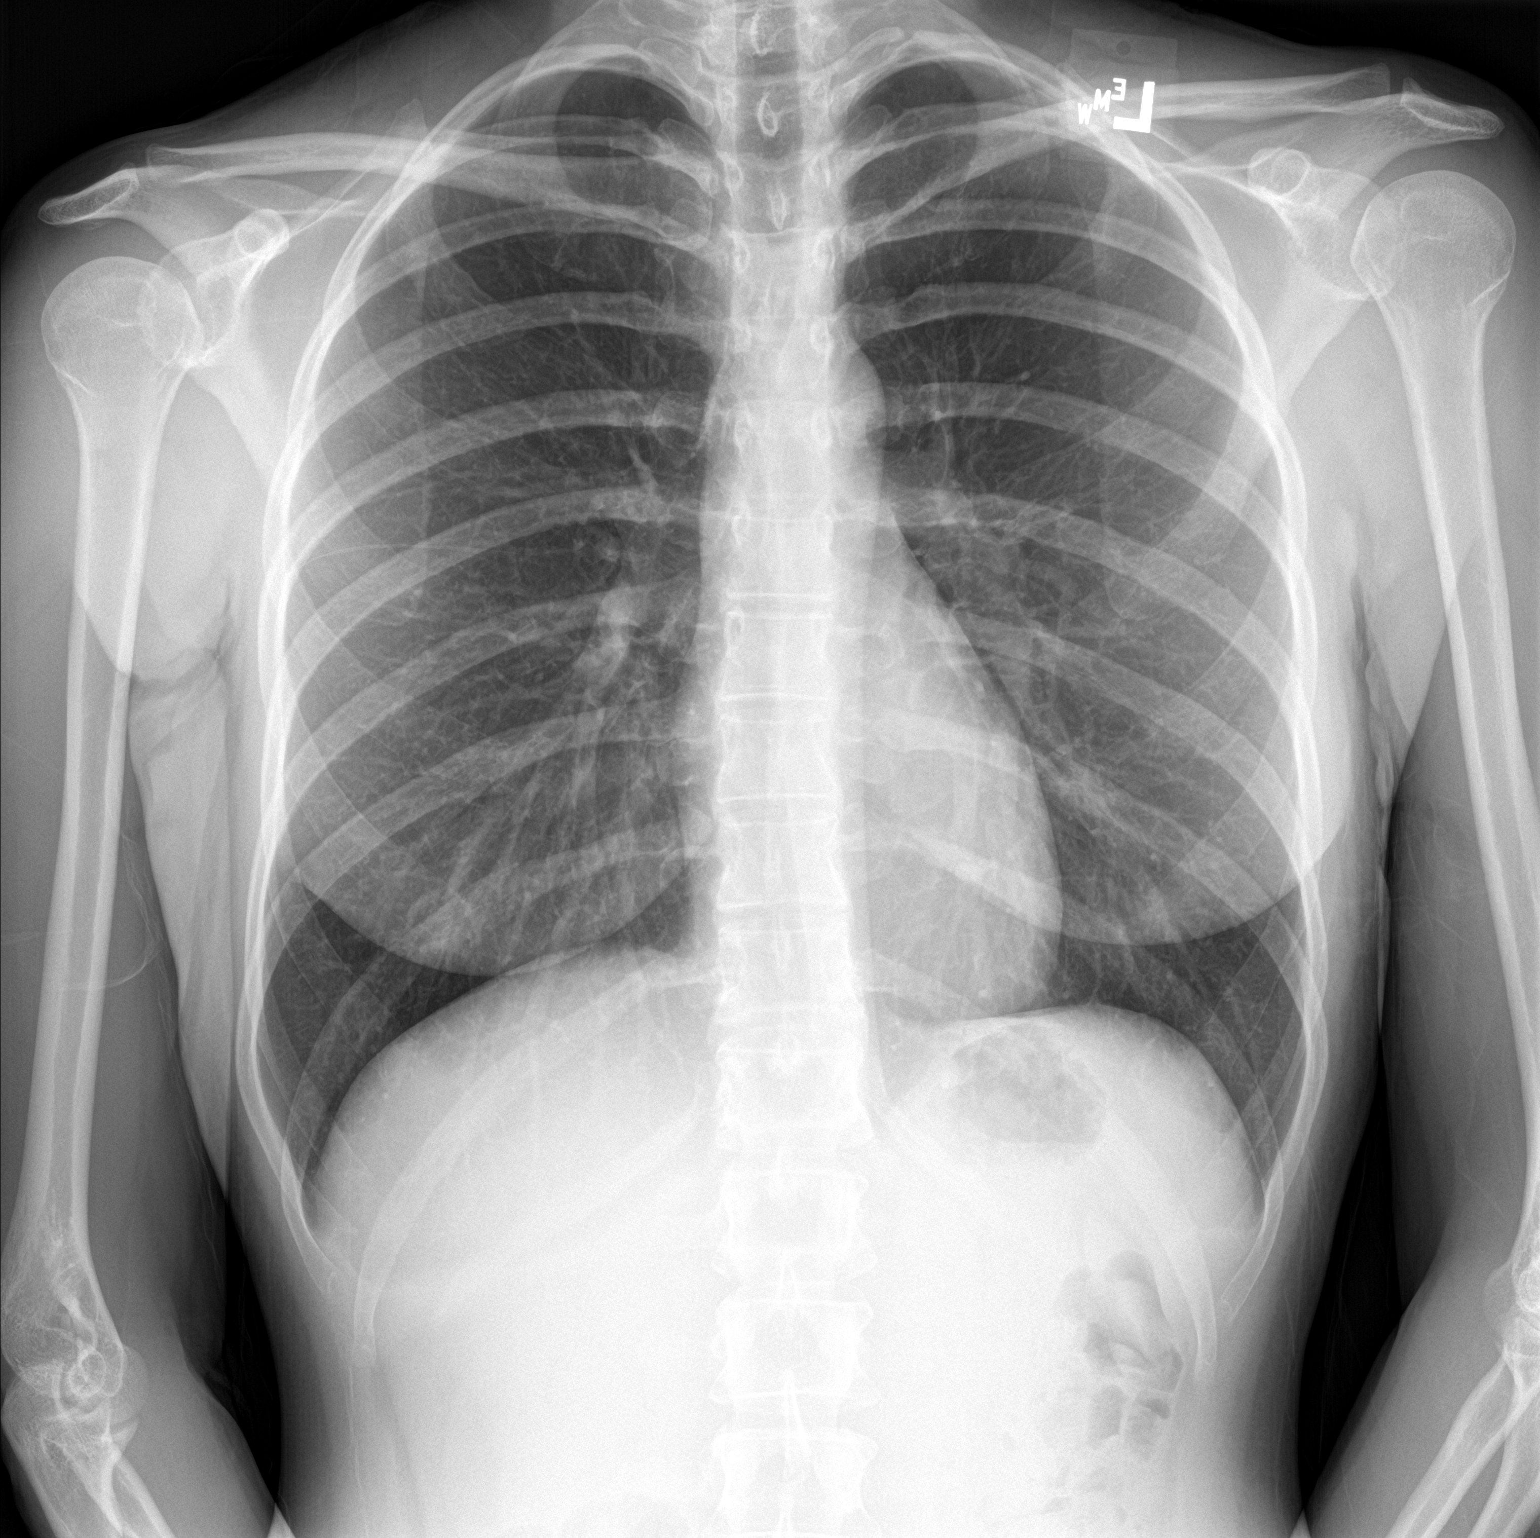

[chest lat]
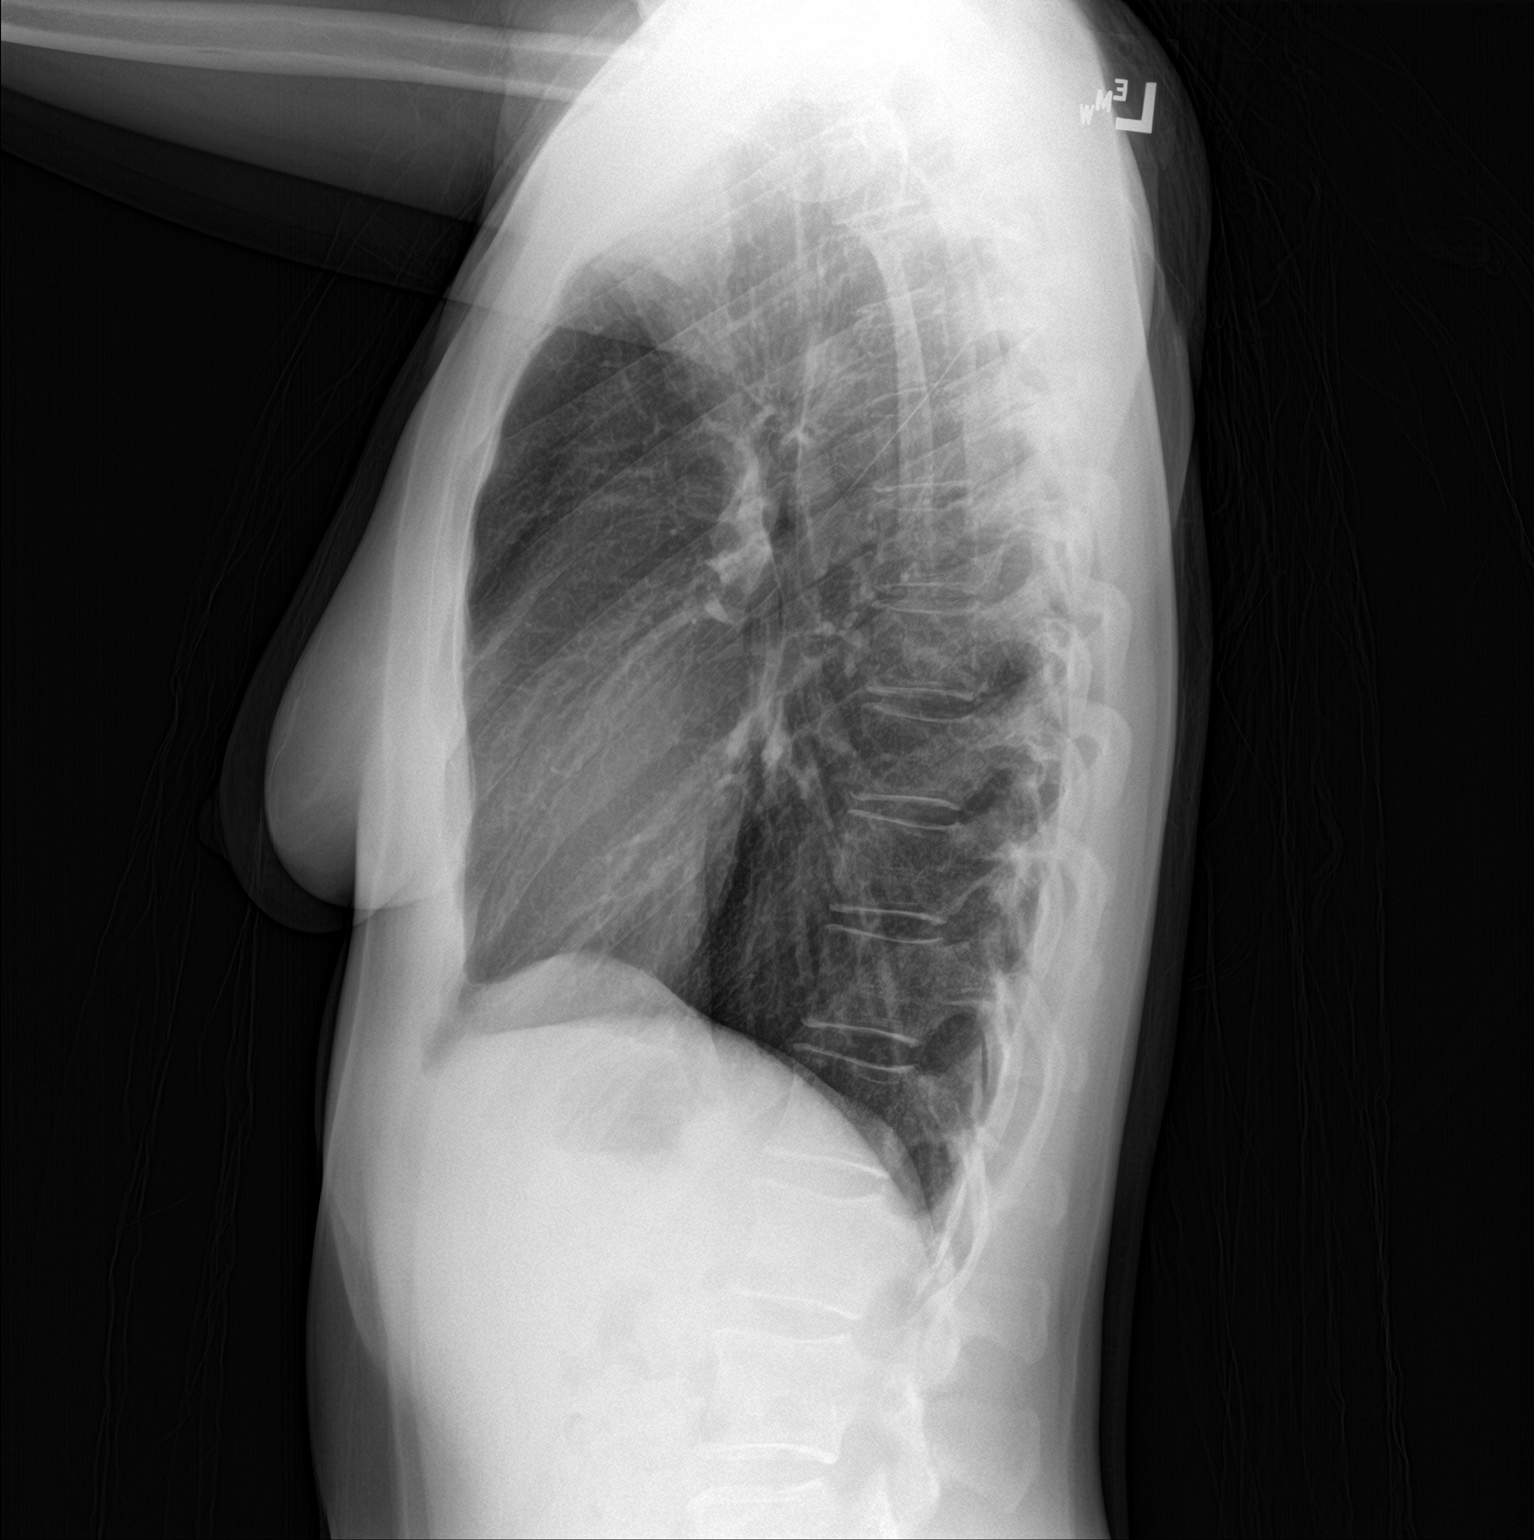

[2 of 2 positions shown; findings below may reference images not displayed]

FINDINGS: The heart size and mediastinal contours are within normal limits.
Both lungs are clear. The visualized skeletal structures are
unremarkable.
IMPRESSION: No active cardiopulmonary disease.

## 2019-05-06 ENCOUNTER — Encounter: Payer: Self-pay | Admitting: Allergy & Immunology

## 2019-05-06 ENCOUNTER — Encounter: Payer: Self-pay | Admitting: Family Medicine

## 2019-05-06 MED ORDER — MONTELUKAST SODIUM 10 MG PO TABS: 10.0000 mg | ORAL_TABLET | Freq: Every day | ORAL | 3 refills | Status: DC

## 2019-05-06 MED ORDER — MONTELUKAST SODIUM 10 MG PO TABS: 10.0000 mg | ORAL_TABLET | Freq: Every day | ORAL | 0 refills | Status: DC

## 2019-05-06 NOTE — Telephone Encounter (Signed)
Montelukast sent in in error for patient as she requested it for her daughter under her mychart. Called mother to verify this she states she already got rx for daughter so disregard message. Writer did call pharmacy to cancel rx as it was sent in

## 2019-06-18 ENCOUNTER — Encounter: Payer: Self-pay | Admitting: Family Medicine

## 2019-07-14 ENCOUNTER — Ambulatory Visit: Payer: BC Managed Care – PPO | Admitting: Neurology

## 2019-07-14 ENCOUNTER — Other Ambulatory Visit: Payer: Self-pay | Admitting: Neurology

## 2019-07-14 ENCOUNTER — Other Ambulatory Visit: Payer: Self-pay

## 2019-07-14 ENCOUNTER — Encounter: Payer: Self-pay | Admitting: Neurology

## 2019-07-14 VITALS — BP 132/89 | HR 73 | Ht 65.5 in | Wt 138.0 lb

## 2019-07-14 DIAGNOSIS — F32A Depression, unspecified: Secondary | ICD-10-CM

## 2019-07-14 DIAGNOSIS — G4710 Hypersomnia, unspecified: Secondary | ICD-10-CM | POA: Diagnosis not present

## 2019-07-14 DIAGNOSIS — F329 Major depressive disorder, single episode, unspecified: Secondary | ICD-10-CM | POA: Diagnosis not present

## 2019-07-14 DIAGNOSIS — G47419 Narcolepsy without cataplexy: Secondary | ICD-10-CM

## 2019-07-14 MED ORDER — SUNOSI 150 MG PO TABS: 150.0000 mg | ORAL_TABLET | ORAL | 0 refills | Status: DC

## 2019-07-14 MED ORDER — SUNOSI 75 MG PO TABS: 150.0000 mg | ORAL_TABLET | ORAL | 0 refills | Status: DC

## 2019-07-14 MED ORDER — SUNOSI 75 MG PO TABS: 75.0000 mg | ORAL_TABLET | ORAL | 0 refills | Status: DC

## 2019-07-14 MED ORDER — SUNOSI 150 MG PO TABS: 150.0000 mg | ORAL_TABLET | ORAL | 5 refills | Status: DC

## 2019-07-14 NOTE — Progress Notes (Signed)
SLEEP MEDICINE CLINIC    Provider:  Larey Seat, MD  Primary Care Physician:  Darreld Mclean, [address] [place] [address]     Referring Provider: Darreld Mclean, Md [address] [place],  [place] [address]          Chief Complaint according to patient   Patient presents with:    . New Patient (Initial Visit)     Second opinion visit.      HISTORY OF PRESENT ILLNESS:  Elizabeth [name] is a 43 y.o. year old Caucasian female patient seen on 07/14/2019. Chief concern according to patient : pt comes today as a Second opinion visit  with Dr Brett Fairy for sleep consult. pt alone, rm 10. pt was diagnosed here 2 yrs ago she has tried and failed Adderall, Xyrem, Nuvigil and currently taking Provigil.   I have the pleasure of seeing Elizabeth [name] today, a 64 -handed White or Caucasian female with a possible sleep disorder.  She  has a past medical history of Allergy, Anemia, Anxiety state (02/15/2016), Arthritis, Depression, Narcolepsy, and PLMD (periodic limb movement disorder)..  She had 2 pregnancies, and has 2 living daughters.  [name] was 2 weeks late, c -section. [name] was a c section at full term.   She had a surgical removal of a neuroma on 6/18-2019.  She is almost choked by tears as she tells me her story.  She also has some chronic back pain sometimes help with Mobic.  She reportedly had some facial tics in 2019 but had suspected that stimulation medication stimulant medication could be the cause.  She was on generic modafinil 2 pills in the morning and 1 pill in the afternoon and was also taking Adderall instead immediate release once daily.  She was seen last by my esteemed partner at [facility]- Dr. Junie Spencer, MD - on 02 June 2018.  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  Medications in the past have all yielded suboptimal results yet. The patient had undergone a sleep study  for the diagnostic polysomnography on 7-29 2018 followed by done an M SLT on 05/19/2017 there was no apnea noted lots of periodic limb movements which did not lead to arousals, no hypoxemia.  The study was followed the next day by an M SLT was for naps subjectively the patient reported dreaming and 2 out of the 4 naps and was noted to go into REM sleep during these 2 naps.  Sleep latencies were brief 5 minutes, 6 minutes and twice at 3 minutes.  Ms. alone indicates a condition of abnormal sleepiness.   Alone the mean sleep onset time with 2  REM sleep onsets is indicative of narcolepsy.  The patient did not have a genetic testing for HLA markers yet.   Reviewing the M SLT worksheet of fifth nap was counseled because 2 of the previously used for naps yielded low sleep latency and to sleep REM onset.  Fam history ; Sleep relevant medical history: Nocturia times 3, Sleep walking: none, Night terrors; none , other Tonsillectomy none. , no cervical spine trauma /surgery. Sinus surgery and deviated spetum surgery were post ponded.   Family medical /sleep history:The patient;s mother and daughter are suffering from  hypersomnia.  Father had sleep terrors, REM BD. She was as sleep walker as a child.    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  Tobacco use; quit -2006 .   ETOH use: glass of wine 5/ week Caffeine intake in form of Coffee( 2 a day) Soda( none ) Tea ( some ) no energy drinks. Regular exercise in form of walking. Yoga.  Can't run as she likes to.    Hobbies : gardening   Sleep habits are as follows:  The patient's dinner time is between 8 PM. The patient goes to bed at 10.30 PM and continues to sleep for4- 5 hours hours, wakes for  bathroom breaks, the first time at 2-3 AM.   The preferred sleep position is on left side  , with the support of 1 pillow. No longer RLS since mirapex was given.  Dreams are reportedly rare since Lexapro-  Dreams were frequent, vivid and terrifying. Sometimes sleep onset hallucination, dream intrusions, was dreaming during naps. She hears noises, touches ACE inhibitor therapy was not prescribed due to sleep paralysis. She reports drooping things when she gets startled, but no knee buckle, jaw drop.   7 AM is the usual rise time. The patient wakes up spontaneously without an alarm.. She reports not feeling long refreshed or restored in AM. Total sleep time - 7-8 hours.  Naps are taken infrequently, lasting from 15-35  minutes , for which she stets an alarm on her telephone. and are refreshing. Longer naps can be less refreshing.    Review of Systems: Out of a complete 14 system review, the patient complains of only the following symptoms, and all other reviewed systems are negative.:  Fatigue, sleepiness ,pressured speech, memory fog, easily distracted.  fragmented sleep, hypersomnia, dream abnormalities, nocturia,    How likely are you to doze in the following situations: 0 = not likely, 1 = slight chance, 2 = moderate chance, 3 = high chance   Sitting and Reading? 3 Watching Television? Sitting inactive in a public place (theater or meeting)?3 As a passenger in a car for an hour without a break? Lying down in the afternoon when circumstances permit?3 Sitting and talking to someone? Sitting quietly after lunch without alcohol?3 In a car, while stopped for a few minutes in traffic?   Total =13- / 24 points   FSS endorsed at 54/ 63 points. HIGH   Social History   Socioeconomic History  . Marital status: Married    Spouse name: Not on file  . Number of children: 2  . Years of education: Masters  . Highest education level: Not on file  Occupational History  . Not on file  Social Needs  . Financial resource strain: Not on file  . Food insecurity    Worry: Not on file     Inability: Not on file  . Transportation needs    Medical: Not on file    Non-medical: Not on file  Tobacco Use  . Smoking status: Former Smoker    Quit date: 10/21/2004    Years since quitting: 14.7  . Smokeless tobacco: Never Used  . Tobacco comment: quit 11 yrs ago  Substance and Sexual Activity  . Alcohol use: No    Alcohol/week: 0.0 standard drinks  . Drug use: Never  . Sexual activity: Yes    Birth control/protection: Surgical  Lifestyle  . Physical activity    Days per week: Not on file    Minutes per session:  Not on file  . Stress: Not on file  Relationships  . Social Herbalist on phone: Not on file    Gets together: Not on file    Attends religious service: Not on file    Active member of club or organization: Not on file    Attends meetings of clubs or organizations: Not on file    Relationship status: Not on file  Other Topics Concern  . Not on file  Social History Narrative   2 caffeine drinks a day     Family History  Problem Relation Age of Onset  . Heart disease Mother   . Depression Mother   . Anxiety disorder Mother   . Diabetes Father   . Depression Father   . Anxiety disorder Father   . Stroke Maternal Grandmother   . Parkinsonism Paternal Grandmother   . Breast cancer Paternal Grandmother   . Bladder Cancer Paternal Grandfather     Past Medical History:  Diagnosis Date  . Allergy   . Anemia   . Anxiety state 02/15/2016  . Arthritis   . Depression   . Narcolepsy   . PLMD (periodic limb movement disorder)     Past Surgical History:  Procedure Laterality Date  . CESAREAN SECTION     X2  . DILATION AND CURETTAGE OF UTERUS    . NASAL SEPTUM SURGERY    . ROOT CANAL     x 2     Current Outpatient Medications on File Prior to Visit  Medication Sig Dispense Refill  . ALPRAZolam (NIRAVAM) 0.25 MG dissolvable tablet TAKE 1 TO 2 TABLETS BY MOUTH AT BEDTIME AS NEEDED FOR ANXIETY 45 tablet 1  . cholecalciferol (VITAMIN D3) 25 MCG  (1000 UT) tablet Take 1,000 Units by mouth daily.    . clobetasol (TEMOVATE) 0.05 % GEL Apply to canker sore up to three times a day as needed 15 each 3  . escitalopram (LEXAPRO) 20 MG tablet Take 1 tablet (20 mg total) by mouth daily. 90 tablet 3  . ferrous sulfate 325 (65 FE) MG EC tablet Take 325 mg by mouth 3 (three) times daily with meals.    . meloxicam (MOBIC) 15 MG tablet Take 1 tablet (15 mg total) by mouth daily. As needed for pain 30 tablet 5  . modafinil (PROVIGIL) 100 MG tablet TAKE 2 TABLETS BY MOUTH EVERY MORNING, AND TAKE 1 TABLET AFTER LUNCH NO LATER THAN 3PM (Patient taking differently: TAKE 3 TABLETS BY MOUTH EVERY MORNING, AND TAKE 1 TABLET AFTER LUNCH NO LATER THAN 3PM) 90 tablet 0  . Multiple Vitamin (MULTIVITAMIN WITH MINERALS) TABS tablet Take 1 tablet by mouth daily.    . pramipexole (MIRAPEX) 0.5 MG tablet Take by mouth.     No current facility-administered medications on file prior to visit.     Allergies  Allergen Reactions  . Sulfa Antibiotics     Physical exam:  Today's Vitals   07/14/19 1114  BP: 132/89  Pulse: 73  Weight: 138 lb (62.6 kg)  Height: 5' 5.5" (1.664 m)   Body mass index is 22.62 kg/m.   Wt Readings from Last 3 Encounters:  07/14/19 138 lb (62.6 kg)  12/10/18 120 lb (54.4 kg)  11/07/18 120 lb (54.4 kg)     Ht Readings from Last 3 Encounters:  07/14/19 5' 5.5" (1.664 m)  12/10/18 5' 6"  (1.676 m)  11/07/18 5' 5"  (1.651 m)      General: The patient is awake,  alert and appears not in acute distress. The patient is well groomed. Head: Normocephalic, atraumatic. Neck is supple. Mallampati 2-3,  neck circumference:13 inches . Nasal airflow  patent.  Retrognathia is seen.  Dental status: intact Cardiovascular:  Regular rate and cardiac rhythm by pulse,  without distended neck veins. Respiratory: Lungs are clear to auscultation.  Skin:  Without evidence of ankle edema, or rash. Trunk: The patient's posture is erect.   Neurologic  exam : The patient is awake and alert, oriented to place and time.   Memory subjective described as impaired .  Attention span & concentration ability appears impaired, pressured speech, tangential answers to simple questions. Speech is fluent,  without  dysarthria, dysphonia or aphasia.  Mood and affect are peculiar.  Cranial nerves: no loss of smell or taste reported  Pupils are equal and briskly reactive to light. Funduscopic exam deferred.  Extraocular movements in vertical and horizontal planes were intact and without nystagmus. No Diplopia. Visual fields by finger perimetry are intact. Hearing was intact .   Facial motor strength is symmetric and tongue and uvula move midline.  Neck ROM : rotation, tilt and flexion extension were normal for age and shoulder shrug was symmetrical.    Motor exam:  Symmetric bulk, tone and ROM.   Normal tone without cog- wheeling, symmetric grip strength . Sensory:  Fine touch, pinprick and vibration were tested  and  normal.  Proprioception tested in the upper extremities was normal.  Coordination: Rapid alternating movements in the fingers/hands were of normal speed.  The Finger-to-nose maneuver was intact without evidence of ataxia, dysmetria or tremor. Gait and station: Patient could rise unassisted from a seated position, walked without assistive device.  Stance is of normal width/ base and the patient turned with 3 steps.  Toe and heel walk were deferred.  Deep tendon reflexes: in the upper and lower extremities are symmetric and intact.  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 tics  in [place]).   3) sleep walking history in daughters, and patient herself. .    My Plan is to proceed with:  1) Sunsosi as the new narcolepsy medication will be used, start on 150 mg .  2)  Set a bedtime and rise time.   3) HLA  Test for narcolepsy.   I would like to thank Copland, Gay Filler, MD and Copland, Gay Filler, [address],  [place] [address] for allowing me to meet with and to take care of this pleasant patient.   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  I plan to follow up either personally or through our NP within 3 month.   CC: I will share my notes with PCP  Electronically signed by: Larey Seat, MD 07/14/2019 11:36 AM  [facility] and [facility] Sleep Board  certified by The [employer] of Sleep Medicine and Diplomate of the [employer] of Sleep Medicine. Board certified In Neurology through the [employer], Fellow of the [employer] of Neurology. Medical Director of [employer].

## 2019-07-14 NOTE — Patient Instructions (Addendum)
Solriamfetol tablets What is this medicine? SOLRIAMFETOL (sol ri AM fe tol) is used to treat excessive sleepiness caused by certain sleep disorders including narcolepsy and obstructive sleep apnea. This medicine may be used for other purposes; ask your health care provider or pharmacist if you have questions. COMMON BRAND NAME(S): SUNOSI What should I tell my health care provider before I take this medicine? They need to know if you have any of these conditions:  bipolar disorder  diabetes  heart disease  high blood pressure  high cholesterol  history of drug abuse or alcohol abuse problem  history of stroke  kidney disease  schizophrenia  an unusual or allergic reaction to solriamfetol, other medicines, foods, dyes, or preservatives  pregnant or trying to get pregnant  breast-feeding How should I use this medicine? Take this medicine by mouth with a glass of water when you first wake up. Do not take it within 9 hours of your planned bedtime. Follow the directions on the prescription label. You can take it with or without food. If it upsets your stomach, take it with food. Take your medicine at regular intervals. Do not take it more often than directed. Do not stop taking except on your doctor's advice. A special MedGuide will be given to you by the pharmacist with each prescription and refill. Be sure to read this information carefully each time. Talk to your pediatrician regarding the use of this medicine in children. Special care may be needed. Overdosage: If you think you have taken too much of this medicine contact a poison control center or emergency room at once. NOTE: This medicine is only for you. Do not share this medicine with others. What if I miss a dose? If you miss a dose, take it as soon as you can. However, avoid taking it within 9 hours of your planned bedtime, since you may find it harder to go to sleep. If it is almost time for your next dose, take only that  dose. Do not take double or extra doses. What may interact with this medicine? Do not take this medicine with any of the following medications:  MAOIs like Carbex, Eldepryl, Marplan, Nardil, and Parnate This medicine may also interact with the following medications:  certain medicines for Parkinson's disease like levodopa, pramipexole, or ropinirole  medicines that increase blood pressure or heart rate This list may not describe all possible interactions. Give your health care provider a list of all the medicines, herbs, non-prescription drugs, or dietary supplements you use. Also tell them if you smoke, drink alcohol, or use illegal drugs. Some items may interact with your medicine. What should I watch for while using this medicine? Visit your healthcare professional for regular checks on your progress. Tell your healthcare professional if your symptoms do not start to get better or if they get worse. This medicine has a risk of abuse and dependence. Your healthcare provider will check you for this while you take this medicine. What side effects may I notice from receiving this medicine? Side effects that you should report to your doctor or health care professional as soon as possible:  allergic reactions like skin rash, itching, and hives; swelling of the face, lips, or tongue  anxiety  changes in emotions or moods  elevated mood, decreased need for sleep, racing thoughts, impulsive behavior  fast heartbeat  hallucinations, loss of contact with reality  irritable  signs and symptoms of a dangerous increase in blood pressure like chest pain; shortness of breath;   sudden severe headache; vision disturbances; seizures; decreased consciousness  signs and symptoms of a stroke like changes in vision; confusion; trouble speaking or understanding; severe headaches; sudden numbness or weakness of the face, arm or leg; trouble walking; dizziness; loss of balance or  coordination  vomiting Side effects that usually do not require medical attention (report these to your doctor or health care professional if they continue or are bothersome):  decreased appetite  dry mouth  increased sweating  nausea  trouble sleeping This list may not describe all possible side effects. Call your doctor for medical advice about side effects. You may report side effects to FDA at 1-800-FDA-1088. Where should I keep my medicine? Keep out of the reach of children. This medicine can be abused. Keep your medicine in a safe place to protect it from theft. Do not share this medicine with anyone. Selling or giving away this medicine is dangerous and is against the law. Store at room temperature between 15 and 30 degrees C (59 and 86 degrees F). This medicine may cause harm and death if it is taken by other adults, children, or pets. Return medicine that has not been used to an official disposal site. Contact the DEA at 484 585 7896 or your city/county government to find a site. If you cannot return the medicine, mix any unused medicine with a substance like cat litter or coffee grounds. Then throw the medicine away in a sealed container like a sealed bag or coffee can with a lid. Do not use the medicine after the expiration date. NOTE: This sheet is a summary. It may not cover all possible information. If you have questions about this medicine, talk to your doctor, pharmacist, or health care provider.  2020 Elsevier/Gold Standard (2018-07-23 12:40:49)  Hypersomnia Hypersomnia is a condition in which a person feels very tired during the day even though he or she gets plenty of sleep at night. A person with this condition may take naps during the day and may find it very difficult to wake up from sleep. Hypersomnia may affect a person's ability to think, concentrate, drive, or remember things. What are the causes? The cause of this condition may not be known. Possible causes  include:  Certain medicines.  Sleep disorders, such as narcolepsy and sleep apnea.  Injury to the head, brain, or spinal cord.  Drug or alcohol use.  Gastroesophageal reflux disease (GERD).  Tumors.  Certain medical conditions, such as depression, diabetes, or an underactive thyroid gland (hypothyroidism). What are the signs or symptoms? The main symptoms of hypersomnia include:  Feeling very tired throughout the day, regardless of how much sleep you got the night before.  Having trouble waking up. Others may find it difficult to wake you up when you are sleeping.  Sleeping for longer and longer periods at a time.  Taking naps throughout the day. Other symptoms may include:  Feeling restless, anxious, or annoyed.  Lacking energy.  Having trouble with: ? Remembering. ? Speaking. ? Thinking.  Loss of appetite.  Seeing, hearing, tasting, smelling, or feeling things that are not real (hallucinations). How is this diagnosed? This condition may be diagnosed based on:  Your symptoms and medical history.  Your sleeping habits. Your health care provider may ask you to write down your sleeping habits in a daily sleep log, along with any symptoms you have.  A series of tests that are done while you sleep (sleep study or polysomnogram).  A test that measures how quickly you can fall  asleep during the day (daytime nap study or multiple sleep latency test). How is this treated? Treatment can help you manage your condition. Treatment may include:  Following a regular sleep routine.  Lifestyle changes, such as changing your eating habits, getting regular exercise, and avoiding alcohol or caffeinated beverages.  Taking medicines to make you more alert (stimulants) during the day.  Treating any underlying medical causes of hypersomnia. Follow these instructions at home: Sleep routine   Schedule the same bedtime and wake-up time each day.  Practice a relaxing bedtime  routine. This may include reading, meditation, deep breathing, or taking a warm bath before going to sleep.  Get regular exercise each day. Avoid strenuous exercise in the evening hours.  Keep your sleep environment at a cooler temperature, darkened, and quiet.  Sleep with pillows and a mattress that are comfortable and supportive.  Schedule short 20-minute naps for when you feel sleepiest during the day.  Talk with your employer or teachers about your hypersomnia. If possible, adjust your schedule so that: ? You have a regular daytime work schedule. ? You can take a scheduled nap during the day. ? You do not have to work or be active at night.  Do not eat a heavy meal for a few hours before bedtime. Eat your meals at about the same times every day.  Avoid drinking alcohol or caffeinated beverages. Safety   Do not drive or use heavy machinery if you are sleepy. Ask your health care provider if it is safe for you to drive.  Wear a life jacket when swimming or spending time near water. General instructions  Take supplements and over-the-counter and prescription medicines only as told by your health care provider.  Keep a sleep log that will help your doctor manage your condition. This may include information about: ? What time you go to bed each night. ? How often you wake up at night. ? How many hours you sleep at night. ? How often and for how long you nap during the day. ? Any observations from others, such as leg movements during sleep, sleep walking, or snoring.  Keep all follow-up visits as told by your health care provider. This is important. Contact a health care provider if:  You have new symptoms.  Your symptoms get worse. Get help right away if:  You have serious thoughts about hurting yourself or someone else. If you ever feel like you may hurt yourself or others, or have thoughts about taking your own life, get help right away. You can go to your nearest  emergency department or call:  Your local emergency services (911 in the [place]  A suicide crisis helpline, such as the [place] at (276) 856-3146. This is open 24 hours a day. Summary  Hypersomnia refers to a condition in which you feel very tired during the day even though you get plenty of sleep at night.  A person with this condition may take naps during the day and may find it very difficult to wake up from sleep.  Hypersomnia may affect a person's ability to think, concentrate, drive, or remember things.  Treatment, such as following a regular sleep routine and making some lifestyle changes, can help you manage your condition. This information is not intended to replace advice given to you by your health care provider. Make sure you discuss any questions you have with your health care provider. Document Released: 09/27/2002 Document Revised: 10/09/2017 Document Reviewed: 10/09/2017 Elsevier Patient Education  [place].  Hypersomnia Hypersomnia is a condition in which a person feels very tired during the day even though he or she gets plenty of sleep at night. A person with this condition may take naps during the day and may find it very difficult to wake up from sleep. Hypersomnia may affect a person's ability to think, concentrate, drive, or remember things. What are the causes? The cause of this condition may not be known. Possible causes include:  Certain medicines.  Sleep disorders, such as narcolepsy and sleep apnea.  Injury to the head, brain, or spinal cord.  Drug or alcohol use.  Gastroesophageal reflux disease (GERD).  Tumors.  Certain medical conditions, such as depression, diabetes, or an underactive thyroid gland (hypothyroidism). What are the signs or symptoms? The main symptoms of hypersomnia include:  Feeling very tired throughout the day, regardless of how much sleep you got the night before.  Having trouble waking  up. Others may find it difficult to wake you up when you are sleeping.  Sleeping for longer and longer periods at a time.  Taking naps throughout the day. Other symptoms may include:  Feeling restless, anxious, or annoyed.  Lacking energy.  Having trouble with: ? Remembering. ? Speaking. ? Thinking.  Loss of appetite.  Seeing, hearing, tasting, smelling, or feeling things that are not real (hallucinations). How is this diagnosed? This condition may be diagnosed based on:  Your symptoms and medical history.  Your sleeping habits. Your health care provider may ask you to write down your sleeping habits in a daily sleep log, along with any symptoms you have.  A series of tests that are done while you sleep (sleep study or polysomnogram).  A test that measures how quickly you can fall asleep during the day (daytime nap study or multiple sleep latency test). How is this treated? Treatment can help you manage your condition. Treatment may include:  Following a regular sleep routine.  Lifestyle changes, such as changing your eating habits, getting regular exercise, and avoiding alcohol or caffeinated beverages.  Taking medicines to make you more alert (stimulants) during the day.  Treating any underlying medical causes of hypersomnia. Follow these instructions at home: Sleep routine   Schedule the same bedtime and wake-up time each day.  Practice a relaxing bedtime routine. This may include reading, meditation, deep breathing, or taking a warm bath before going to sleep.  Get regular exercise each day. Avoid strenuous exercise in the evening hours.  Keep your sleep environment at a cooler temperature, darkened, and quiet.  Sleep with pillows and a mattress that are comfortable and supportive.  Schedule short 20-minute naps for when you feel sleepiest during the day.  Talk with your employer or teachers about your hypersomnia. If possible, adjust your schedule so  that: ? You have a regular daytime work schedule. ? You can take a scheduled nap during the day. ? You do not have to work or be active at night.  Do not eat a heavy meal for a few hours before bedtime. Eat your meals at about the same times every day.  Avoid drinking alcohol or caffeinated beverages. Safety   Do not drive or use heavy machinery if you are sleepy. Ask your health care provider if it is safe for you to drive.  Wear a life jacket when swimming or spending time near water. General instructions  Take supplements and over-the-counter and prescription medicines only as told by your health care provider.  Keep a  sleep log that will help your doctor manage your condition. This may include information about: ? What time you go to bed each night. ? How often you wake up at night. ? How many hours you sleep at night. ? How often and for how long you nap during the day. ? Any observations from others, such as leg movements during sleep, sleep walking, or snoring.  Keep all follow-up visits as told by your health care provider. This is important. Contact a health care provider if:  You have new symptoms.  Your symptoms get worse. Get help right away if:  You have serious thoughts about hurting yourself or someone else. If you ever feel like you may hurt yourself or others, or have thoughts about taking your own life, get help right away. You can go to your nearest emergency department or call:  Your local emergency services (911 in the [place]  A suicide crisis helpline, such as the [facility] at 423 814 9168. This is open 24 hours a day. Summary  Hypersomnia refers to a condition in which you feel very tired during the day even though you get plenty of sleep at night.  A person with this condition may take naps during the day and may find it very difficult to wake up from sleep.  Hypersomnia may affect a person's ability to think,  concentrate, drive, or remember things.  Treatment, such as following a regular sleep routine and making some lifestyle changes, can help you manage your condition. This information is not intended to replace advice given to you by your health care provider. Make sure you discuss any questions you have with your health care provider. Document Released: 09/27/2002 Document Revised: 10/09/2017 Document Reviewed: 10/09/2017 Elsevier Patient Education  2020 [employer].

## 2019-07-14 NOTE — Addendum Note (Signed)
Addended by: Darleen Crocker on: 07/14/2019 01:38 PM   Modules accepted: Orders

## 2019-07-15 ENCOUNTER — Encounter: Payer: Self-pay | Admitting: Family Medicine

## 2019-07-15 NOTE — Telephone Encounter (Signed)
I went ahead and refilled the singulair for the girls. Could you please advise on the referral?

## 2019-07-22 LAB — NARCOLEPSY EVALUATION
DQA1*01:02: NEGATIVE
DQB1*06:02: NEGATIVE

## 2019-07-27 ENCOUNTER — Encounter: Payer: Self-pay | Admitting: Neurology

## 2019-07-28 ENCOUNTER — Encounter: Payer: Self-pay | Admitting: Family Medicine

## 2019-07-28 NOTE — Telephone Encounter (Signed)
Any details I need to give them? When I call do I just say im setting uo a "sleep appointment?"

## 2019-07-29 ENCOUNTER — Other Ambulatory Visit: Payer: Self-pay | Admitting: Family Medicine

## 2019-07-29 ENCOUNTER — Ambulatory Visit: Payer: BLUE CROSS/BLUE SHIELD | Admitting: Family Medicine

## 2019-07-29 DIAGNOSIS — F411 Generalized anxiety disorder: Secondary | ICD-10-CM

## 2019-07-29 NOTE — Telephone Encounter (Signed)
Requesting:Alprazolam Contract:none, needs csc UDS:05/19/2017 needs updated uds Last Visit:03/04/2019 Next Visit:none scheduled Last Refill:04/11/2019  Please Advise

## 2019-07-31 ENCOUNTER — Other Ambulatory Visit: Payer: Self-pay | Admitting: Family Medicine

## 2019-08-09 DIAGNOSIS — L650 Telogen effluvium: Secondary | ICD-10-CM | POA: Diagnosis not present

## 2019-08-12 ENCOUNTER — Encounter: Payer: Self-pay | Admitting: Family Medicine

## 2019-09-03 ENCOUNTER — Encounter: Payer: Self-pay | Admitting: Family Medicine

## 2019-09-03 MED ORDER — MELOXICAM 15 MG PO TABS: 15.0000 mg | ORAL_TABLET | Freq: Every day | ORAL | 5 refills | Status: DC

## 2019-10-18 ENCOUNTER — Encounter: Payer: Self-pay | Admitting: Family Medicine

## 2019-10-18 NOTE — Telephone Encounter (Signed)
Not on patients current med list please advise.

## 2019-11-25 ENCOUNTER — Encounter: Payer: Self-pay | Admitting: Family Medicine

## 2019-12-06 ENCOUNTER — Encounter: Payer: Self-pay | Admitting: Neurology

## 2019-12-07 ENCOUNTER — Other Ambulatory Visit: Payer: Self-pay | Admitting: Family Medicine

## 2019-12-07 ENCOUNTER — Encounter: Payer: Self-pay | Admitting: Family Medicine

## 2019-12-07 ENCOUNTER — Other Ambulatory Visit: Payer: Self-pay | Admitting: Neurology

## 2019-12-07 DIAGNOSIS — F411 Generalized anxiety disorder: Secondary | ICD-10-CM

## 2019-12-07 MED ORDER — ESCITALOPRAM OXALATE 20 MG PO TABS: 20.0000 mg | ORAL_TABLET | Freq: Every day | ORAL | 0 refills | Status: DC

## 2019-12-07 MED ORDER — SUNOSI 150 MG PO TABS: 150.0000 mg | ORAL_TABLET | ORAL | 0 refills | Status: DC

## 2019-12-07 NOTE — Telephone Encounter (Signed)
Requesting:Alprazolam Contract:none [id number] Last Visit:03/04/2019 Next Visit:none Last Refill:07/29/2019  Please Advise

## 2019-12-27 ENCOUNTER — Other Ambulatory Visit: Payer: Self-pay | Admitting: Family Medicine

## 2019-12-27 ENCOUNTER — Encounter: Payer: Self-pay | Admitting: Family Medicine

## 2019-12-27 DIAGNOSIS — F411 Generalized anxiety disorder: Secondary | ICD-10-CM

## 2019-12-27 NOTE — Telephone Encounter (Signed)
Requesting:Alprazolam Contract:none [id number] Last Visit:03/04/2019 Next Visit:none, needs visit Last Refill:12/07/2019  Please Advise

## 2020-01-24 ENCOUNTER — Other Ambulatory Visit: Payer: Self-pay | Admitting: Neurology

## 2020-01-24 NOTE — Telephone Encounter (Signed)
Refilled, CD

## 2020-02-14 ENCOUNTER — Other Ambulatory Visit: Payer: Self-pay | Admitting: Family Medicine

## 2020-02-14 DIAGNOSIS — F411 Generalized anxiety disorder: Secondary | ICD-10-CM

## 2020-02-15 ENCOUNTER — Encounter: Payer: Self-pay | Admitting: Family Medicine

## 2020-02-24 ENCOUNTER — Encounter: Payer: Self-pay | Admitting: Family Medicine

## 2020-03-09 ENCOUNTER — Encounter: Payer: Self-pay | Admitting: Family Medicine

## 2020-03-09 NOTE — Telephone Encounter (Signed)
Sent as FYI. 

## 2020-03-22 ENCOUNTER — Encounter: Payer: Self-pay | Admitting: Podiatry

## 2020-03-22 ENCOUNTER — Ambulatory Visit (INDEPENDENT_AMBULATORY_CARE_PROVIDER_SITE_OTHER): Payer: BC Managed Care – PPO

## 2020-03-22 ENCOUNTER — Other Ambulatory Visit: Payer: Self-pay

## 2020-03-22 ENCOUNTER — Ambulatory Visit: Payer: BC Managed Care – PPO | Admitting: Podiatry

## 2020-03-22 DIAGNOSIS — D3610 Benign neoplasm of peripheral nerves and autonomic nervous system, unspecified: Secondary | ICD-10-CM | POA: Diagnosis not present

## 2020-03-22 DIAGNOSIS — M7752 Other enthesopathy of left foot: Secondary | ICD-10-CM | POA: Diagnosis not present

## 2020-03-22 DIAGNOSIS — D3613 Benign neoplasm of peripheral nerves and autonomic nervous system of lower limb, including hip: Secondary | ICD-10-CM

## 2020-03-22 DIAGNOSIS — M779 Enthesopathy, unspecified: Secondary | ICD-10-CM

## 2020-03-22 DIAGNOSIS — M7751 Other enthesopathy of right foot: Secondary | ICD-10-CM

## 2020-03-22 NOTE — Progress Notes (Signed)
Subjective:   Patient ID: Elizabeth [name], female   DOB: 44 y.o.   MRN: [id number]   HPI Patient states she has got an increase in discomfort on both her feet and she probably needs some kind of inserts and states that the nerve that we took out years ago is doing well   ROS      Objective:  Physical Exam  Neurovascular status intact muscle strength found to be adequate range of motion within normal limits with well-healed surgical site third interspace right with inflammation fluid of the third MPJ both feet that is painful when pressed and make shoe gear difficult.  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  She will use rigid bottom shoe she will be seen back for me to recheck again and I educated her on this condition possibility for shortening osteotomy at 1 point in future  X-rays indicate that there is slight elongation third metatarsal bilateral

## 2020-04-06 ENCOUNTER — Other Ambulatory Visit: Payer: BC Managed Care – PPO | Admitting: Orthotics

## 2020-04-11 ENCOUNTER — Other Ambulatory Visit: Payer: Self-pay

## 2020-04-11 ENCOUNTER — Ambulatory Visit: Payer: BC Managed Care – PPO | Admitting: Orthotics

## 2020-04-11 DIAGNOSIS — D3610 Benign neoplasm of peripheral nerves and autonomic nervous system, unspecified: Secondary | ICD-10-CM

## 2020-04-11 DIAGNOSIS — M779 Enthesopathy, unspecified: Secondary | ICD-10-CM

## 2020-04-12 NOTE — Progress Notes (Signed)
Patient came in today to pick up custom made foot orthotics.  The goals were accomplished and the patient reported no dissatisfaction with said orthotics.  Patient was advised of breakin period and how to report any issues. 

## 2020-04-16 ENCOUNTER — Other Ambulatory Visit: Payer: Self-pay | Admitting: Family Medicine

## 2020-04-16 DIAGNOSIS — F411 Generalized anxiety disorder: Secondary | ICD-10-CM

## 2020-05-01 ENCOUNTER — Encounter: Payer: Self-pay | Admitting: Family Medicine

## 2020-05-01 DIAGNOSIS — F411 Generalized anxiety disorder: Secondary | ICD-10-CM

## 2020-05-01 MED ORDER — ESCITALOPRAM OXALATE 20 MG PO TABS: 20.0000 mg | ORAL_TABLET | Freq: Every day | ORAL | 0 refills | Status: DC

## 2020-05-02 MED ORDER — ESCITALOPRAM OXALATE 20 MG PO TABS: 20.0000 mg | ORAL_TABLET | Freq: Every day | ORAL | 0 refills | Status: DC

## 2020-05-02 NOTE — Telephone Encounter (Signed)
[facility] faxed request for 90 day supply stating insurance will only pay for 90 day supply. Pt also sent mychart message requesting the same. I see pt is past due for an appointment as well.  Please let pharmacy / pt know what to do.  [facility].

## 2020-05-02 NOTE — Addendum Note (Signed)
Addended by: Wynonia Musty A on: 05/02/2020 03:57 PM   Modules accepted: Orders

## 2020-05-08 ENCOUNTER — Other Ambulatory Visit: Payer: Self-pay | Admitting: Family Medicine

## 2020-05-08 DIAGNOSIS — F411 Generalized anxiety disorder: Secondary | ICD-10-CM

## 2020-06-15 ENCOUNTER — Other Ambulatory Visit: Payer: Self-pay | Admitting: Family Medicine

## 2020-06-15 DIAGNOSIS — F411 Generalized anxiety disorder: Secondary | ICD-10-CM

## 2020-06-16 ENCOUNTER — Encounter: Payer: Self-pay | Admitting: Family Medicine

## 2020-06-16 NOTE — Telephone Encounter (Signed)
Last Alprazolam ODT RX: 05/08/20, #20  1 refill. She filled Rx on 7/19 and 8/9. Last OV:  03/04/19, ? Past due, please advise. Next OV: not scheduled UDS: not on file CSC: not on file

## 2020-07-24 ENCOUNTER — Other Ambulatory Visit: Payer: Self-pay | Admitting: Family Medicine

## 2020-07-24 DIAGNOSIS — F411 Generalized anxiety disorder: Secondary | ICD-10-CM

## 2020-07-27 ENCOUNTER — Other Ambulatory Visit: Payer: Self-pay | Admitting: Neurology

## 2020-07-29 ENCOUNTER — Encounter: Payer: Self-pay | Admitting: Family Medicine

## 2020-07-30 MED ORDER — SUNOSI 150 MG PO TABS: 150.0000 mg | ORAL_TABLET | ORAL | 0 refills | Status: DC

## 2020-08-24 ENCOUNTER — Other Ambulatory Visit: Payer: Self-pay | Admitting: Family Medicine

## 2020-08-28 ENCOUNTER — Other Ambulatory Visit: Payer: Self-pay | Admitting: Family Medicine

## 2020-08-29 ENCOUNTER — Encounter: Payer: Self-pay | Admitting: Family Medicine

## 2020-09-26 ENCOUNTER — Other Ambulatory Visit: Payer: Self-pay | Admitting: Family Medicine

## 2020-09-26 DIAGNOSIS — F411 Generalized anxiety disorder: Secondary | ICD-10-CM

## 2020-09-27 ENCOUNTER — Other Ambulatory Visit: Payer: Self-pay

## 2020-09-27 ENCOUNTER — Ambulatory Visit: Payer: BC Managed Care – PPO | Admitting: Adult Health

## 2020-09-27 ENCOUNTER — Encounter: Payer: Self-pay | Admitting: Adult Health

## 2020-09-27 VITALS — BP 134/79 | HR 74 | Ht 65.0 in | Wt 146.0 lb

## 2020-09-27 DIAGNOSIS — G47419 Narcolepsy without cataplexy: Secondary | ICD-10-CM

## 2020-09-27 MED ORDER — WAKIX 17.8 MG PO TABS: 17.8000 mg | ORAL_TABLET | Freq: Every day | ORAL | 5 refills | Status: DC

## 2020-09-27 NOTE — Progress Notes (Signed)
[name] DOB: 1976-03-22  REASON FOR VISIT: follow up HISTORY FROM: [name]  HISTORY OF PRESENT ILLNESS: Today 09/27/20:  Elizabeth [name] is a 44 year old female with a history of narcolepsy.  She returns today for follow-up.  She was started on Sunosi 150 mg daily.  She reports that this was beneficial initially.  She states that now Elizabeth medication typically wears off by 3 PM.  She states that sometimes this is dependent on her caffeine intake as well.  She also notices that her sleepiness is triggered by Elizabeth weather.  She states on a sunny day she feels that she has more energy rather than a cloudy day she feels that she is more tired.  She denies depression.  Returns today for an evaluation.  In Elizabeth past has tried Adderall, Xyrem, nuvigil, provigil  HISTORY 07/14/2019. Chiefconcernaccording to [name] : pt comes today as a Second opinion visit  with Dr Brett Fairy for sleep consult. pt alone, rm 10. pt was diagnosed here 2 yrs ago she has tried and failed Adderall, Xyrem, Nuvigil and currently taking Provigil.  I have Elizabeth pleasure of seeing Elizabeth [name] today,a 67 -handed White or Caucasian female with a possible sleep disorder. She  has a past medical history of Allergy, Anemia, Anxiety state (02/15/2016), Arthritis, Depression, Narcolepsy, and PLMD (periodic limb movement disorder)..  She had 2 pregnancies, and has 2 living daughters.  [name] was 2 weeks late, c -section. [name] was a c section at full term.   She had a surgical removal of a neuroma on 6/18-2019.  She is almost choked by tears as she tells me her story.  She also has some chronic back pain sometimes help with Mobic.  She reportedly had some facial tics in 2019 but had suspected that stimulation medication stimulant medication could be Elizabeth cause.  She was on generic modafinil 2 pills in Elizabeth morning and 1 pill in Elizabeth afternoon and was also taking Adderall instead immediate release once daily.  She was seen  last by my esteemed partner at [facility] Dr. Junie Spencer, MD - on 02 June 2018.  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  Medications in Elizabeth past have all yielded suboptimal results yet. Elizabeth [name] had undergone a sleep study for Elizabeth diagnostic polysomnography on 7-29 2018 followed by done an M SLT on 05/19/2017 there was no apnea noted lots of periodic limb movements which did not lead to arousals, no hypoxemia.  Elizabeth study was followed Elizabeth next day by an M SLT was for naps subjectively Elizabeth [name] reported dreaming and 2 out of Elizabeth 4 naps and was noted to go into REM sleep during these 2 naps.  Sleep latencies were brief 5 minutes, 6 minutes and twice at 3 minutes.  Ms. alone indicates a condition of abnormal sleepiness.   Alone Elizabeth mean sleep onset time with 2  REM sleep onsets is indicative of narcolepsy.  Elizabeth [name] did not have a genetic testing for HLA markers yet.   Reviewing Elizabeth M SLT worksheet of fifth nap was counseled because 2 of Elizabeth previously used for naps yielded low sleep latency and to sleep REM onset.  Fam history ; Sleeprelevant medical history: Nocturia times 3, Sleep walking: none, Night terrors; none , other Tonsillectomy none. , no cervical spine trauma /surgery. Sinus surgery and deviated spetum surgery were post ponded.  Familymedical /sleep history:Elizabeth [name];s mother and daughter are suffering from  hypersomnia.  Father had  sleep terrors, REM BD. She was as sleep walker as a child.   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  Tobacco use; quit -2006 .  ETOH use: glass of wine 5/ week Caffeine intake in form of Coffee( 2 a day) Soda( none ) Tea ( some ) no energy drinks. Regular exercise in form of walking. Yoga.  Can't run as she likes to.     Hobbies : gardening   Sleep habits are as follows: Elizabeth [name]'s dinner time is between 8 PM. Elizabeth [name] goes to bed at 10.30 PM and continues to sleep for4- 5 hours hours, wakes for  bathroom breaks, Elizabeth first time at 2-3 AM.   Elizabeth preferred sleep position is on left side , with Elizabeth support of 1 pillow. No longer RLS since mirapex was given.  Dreams are reportedly rare since Lexapro-  Dreams were frequent, vivid and terrifying. Sometimes sleep onset hallucination, dream intrusions, was dreaming during naps. She hears noises, touches ACE inhibitor therapy was not prescribed due to sleep paralysis. She reports drooping things when she gets startled, but no knee buckle, jaw drop.   7 AM is Elizabeth usual rise time. Elizabeth [name] wakes up spontaneously without an alarm.. She reports not feeling long refreshed or restored in AM. Total sleep time - 7-8 hours.  Naps are taken infrequently, lasting from 15-35  minutes , for which she stets an alarm on her telephone. and are refreshing. Longer naps can be less refreshing.   REVIEW OF SYSTEMS: Out of a complete 14 system review of symptoms, Elizabeth [name] complains only of Elizabeth following symptoms, and all other reviewed systems are negative.  Epworth sleepiness score 11 Fatigue severity score 55  ALLERGIES: Allergies  Allergen Reactions  . Sulfa Antibiotics     HOME MEDICATIONS: Outpatient Medications Prior to Visit  Medication Sig Dispense Refill  . ALPRAZolam (NIRAVAM) 0.25 MG dissolvable tablet TAKE 1 TO 2 TABLETS BY MOUTH AT BEDTIME AS NEEDED FOR ANXIETY 20 tablet 0  . cholecalciferol (VITAMIN D3) 25 MCG (1000 UT) tablet Take 1,000 Units by mouth daily.    . clobetasol (TEMOVATE) 0.05 % GEL Apply to canker sore up to three times a day as needed 15 each 3  . escitalopram (LEXAPRO) 20 MG tablet TAKE 1 TABLET BY MOUTH EVERY DAY 90 tablet 0  . ferrous sulfate 325 (65 FE) MG EC tablet Take 325 mg by mouth 3 (three) times daily with meals.    .  meloxicam (MOBIC) 15 MG tablet Take 1 tablet (15 mg total) by mouth daily. As needed for pain 30 tablet 5  . Multiple Vitamin (MULTIVITAMIN WITH MINERALS) TABS tablet Take 1 tablet by mouth daily.    . pramipexole (MIRAPEX) 0.5 MG tablet Take by mouth.    . Solriamfetol HCl (SUNOSI) 150 MG TABS Take 150 mg by mouth every morning. Start with 75 mg ( 1/2 tab in AM for Elizabeth first 8 days) 30 tablet 5  . Solriamfetol HCl (SUNOSI) 150 MG TABS Take 150 mg by mouth every morning. 14 tablet 0  . SUNOSI 150 MG TABS TAKE 150 MG BY MOUTH EVERY MORNING. 30 tablet 5   No facility-administered medications prior to visit.    PAST MEDICAL HISTORY: Past Medical History:  Diagnosis Date  . Allergy   . Anemia   .  Anxiety state 02/15/2016  . Arthritis   . Depression   . Narcolepsy   . PLMD (periodic limb movement disorder)     PAST SURGICAL HISTORY: Past Surgical History:  Procedure Laterality Date  . CESAREAN SECTION     X2  . DILATION AND CURETTAGE OF UTERUS    . NASAL SEPTUM SURGERY    . ROOT CANAL     x 2    FAMILY HISTORY: Family History  Problem Relation Age of Onset  . Heart disease Mother   . Depression Mother   . Anxiety disorder Mother   . Diabetes Father   . Depression Father   . Anxiety disorder Father   . Stroke Maternal Grandmother   . Parkinsonism Paternal Grandmother   . Breast cancer Paternal Grandmother   . Bladder Cancer Paternal Grandfather     SOCIAL HISTORY: Social History   Socioeconomic History  . Marital status: Married    Spouse name: Not on file  . Number of children: 2  . Years of education: Masters  . Highest education level: Not on file  Occupational History  . Not on file  Tobacco Use  . Smoking status: Former Smoker    Quit date: 10/21/2004    Years since quitting: 15.9  . Smokeless tobacco: Never Used  . Tobacco comment: quit 11 yrs ago  Vaping Use  . Vaping Use: Never used  Substance and Sexual Activity  . Alcohol use: No    Alcohol/week:  0.0 standard drinks  . Drug use: Never  . Sexual activity: Yes    Birth control/protection: Surgical  Other Topics Concern  . Not on file  Social History Narrative   2 caffeine drinks a day    Social Determinants of Health   Financial Resource Strain:   . Difficulty of Paying Living Expenses: Not on file  Food Insecurity:   . Worried About Charity fundraiser in Elizabeth Last Year: Not on file  . Ran Out of Food in Elizabeth Last Year: Not on file  Transportation Needs:   . Lack of Transportation (Medical): Not on file  . Lack of Transportation (Non-Medical): Not on file  Physical Activity:   . Days of Exercise per Week: Not on file  . Minutes of Exercise per Session: Not on file  Stress:   . Feeling of Stress : Not on file  Social Connections:   . Frequency of Communication with Friends and Family: Not on file  . Frequency of Social Gatherings with Friends and Family: Not on file  . Attends Religious Services: Not on file  . Active Member of Clubs or Organizations: Not on file  . Attends Archivist Meetings: Not on file  . Marital Status: Not on file  Intimate Partner Violence:   . Fear of Current or Ex-Partner: Not on file  . Emotionally Abused: Not on file  . Physically Abused: Not on file  . Sexually Abused: Not on file      PHYSICAL EXAM  Vitals:   09/27/20 1403  BP: 134/79  Pulse: 74  Weight: 146 lb (66.2 kg)  Height: _0  (1.651 m)   Body mass index is 24.3 kg/m.  Generalized: Well developed, in no acute distress   Neurological examination  Mentation: Alert oriented to time, place, history taking. Follows all commands speech and language fluent Cranial nerve II-XII: Pupils were equal round reactive to light. Extraocular movements were full, visual field were full on confrontational test, Head turning and shoulder  shrug  were normal and symmetric. Motor: Elizabeth motor testing reveals 5 over 5 strength of all 4 extremities. Good symmetric motor tone is noted  throughout.  Sensory: Sensory testing is intact to soft touch on all 4 extremities. No evidence of extinction is noted.  Coordination: Cerebellar testing reveals good finger-nose-finger and heel-to-shin bilaterally.  Gait and station: Gait is normal.  Reflexes: Deep tendon reflexes are symmetric and normal bilaterally.   DIAGNOSTIC DATA (LABS, IMAGING, TESTING) - I reviewed [name] records, labs, notes, testing and imaging myself where available.  Lab Results  Component Value Date   WBC 4.4 11/10/2018   HGB 13.0 11/10/2018   HCT 38.8 11/10/2018   MCV 91.1 11/10/2018   PLT 302.0 11/10/2018      Component Value Date/Time   NA 139 11/10/2018 1123   K 4.7 11/10/2018 1123   CL 102 11/10/2018 1123   CO2 32 11/10/2018 1123   GLUCOSE 81 11/10/2018 1123   BUN 11 11/10/2018 1123   CREATININE 0.66 11/10/2018 1123   CALCIUM 9.4 11/10/2018 1123   PROT 7.3 11/10/2018 1123   ALBUMIN 4.6 11/10/2018 1123   AST 14 11/10/2018 1123   ALT 9 11/10/2018 1123   ALKPHOS 48 11/10/2018 1123   BILITOT 0.3 11/10/2018 1123   Lab Results  Component Value Date   CHOL 163 07/22/2018   HDL 70.10 07/22/2018   LDLCALC 82 07/22/2018   TRIG 54.0 07/22/2018   CHOLHDL 2 07/22/2018   Lab Results  Component Value Date   HGBA1C 5.3 07/22/2018   No results found for: [id number] Lab Results  Component Value Date   TSH 1.92 11/10/2018      ASSESSMENT AND PLAN 44 y.o. year old female  has a past medical history of Allergy, Anemia, Anxiety state (02/15/2016), Arthritis, Depression, Narcolepsy, and PLMD (periodic limb movement disorder). here with :  1.  Narcolepsy  --Stop Sunosi --Start Wakix 17.8 mg daily.  I provided Elizabeth [name] with a handout reviewing this medication.  Advised if this is not beneficial we can consider increasing her dose. --Follow-up in 6 months or sooner if needed   I spent 25 minutes of face-to-face and non-face-to-face time with [name].  This included previsit chart review,  lab review, study review, order entry, electronic health record documentation, [name] education.  Ward Givens, MSN, NP-C 09/27/2020, 1:59 PM [facility] [address], [place], [place] [address] [phone]

## 2020-09-27 NOTE — Patient Instructions (Signed)
Your Plan:  Stop [employer] 17.8 mg daily    Thank you for coming to see [place] at [facility]. I hope we have been able to provide you high quality care today.  You may receive a patient satisfaction survey over the next few weeks. We would appreciate your feedback and comments so that we may continue to improve ourselves and the health of our patients.  Pitolisant oral tablets What is this medicine? PITOLISANT (pi TOL i sant) is used to treat excessive sleepiness or cataplexy in patients with narcolepsy. Cataplexy causes a sudden muscle weakness due to a strong emotional response. This medicine may be used for other purposes; ask your health care provider or pharmacist if you have questions. COMMON BRAND NAME(S): Wakix What should I tell my health care provider before I take this medicine? They need to know if you have any of these conditions:  heart disease  history of irregular heartbeat  kidney disease  liver disease  low levels of potassium in the blood  low levels of magnesium in the blood  an unusual or allergic reaction to pitolisant, other foods, dyes or preservatives  pregnant or trying to get pregnant  breast-feeding How should I use this medicine? Take this medicine by mouth with a glass of water. Follow the directions on the prescription label. You can take it with or without food. If it upsets your stomach, take it with food. Take your medicine at regular intervals. Do not take it more often than directed. Do not stop taking except on your doctor's advice. Talk to your pediatrician about the use of this medicine in children. Special care may be needed. Overdosage: If you think you have taken too much of this medicine contact a poison control center or emergency room at once. NOTE: This medicine is only for you. Do not share this medicine with others. What if I miss a dose? If you miss a dose, skip it. Take your next dose at the normal  time. Do not take extra or 2 doses at the same time to make up for the missed dose. What may interact with this medicine? Do not take this medicine with any of the following medications:  cisapride  dronedarone  pimozide  thioridazine This medicine may also interact with the following medications:  antihistamines for allergy, cough and cold  birth control pills or other hormone-containing birth control devices or implants  certain medicines for depression, anxiety, or psychotic disturbances  certain medicines for irregular heart beat like amiodarone, dofetilide, encainide, flecainide, propafenone, quinidine  certain medicines for seizures like carbamazepine, phenobarbital, phenytoin  cyclosporine  midazolam  other medicines that prolong the QT interval (abnormal heart rhythm)  promethazine  rifampin  St. John's wort This list may not describe all possible interactions. Give your health care provider a list of all the medicines, herbs, non-prescription drugs, or dietary supplements you use. Also tell them if you smoke, drink alcohol, or use illegal drugs. Some items may interact with your medicine. What should I watch for while using this medicine? Visit your healthcare professional for regular checks on your progress. Tell your healthcare professional if your symptoms do not start to get better or if they get worse. Your mouth may get dry. Chewing sugarless gum or sucking hard candy and drinking plenty of water may help. Contact your healthcare professional if the problem does not go away or is severe. Birth control may not work properly while you are taking this medicine. Talk  to your healthcare professional about using an extra method of birth control. What side effects may I notice from receiving this medicine? Side effects that you should report to your doctor or health care professional as soon as possible:  allergic reactions like skin rash, itching or hives; swelling  of the face, lips, or tongue  anxious  changes in emotions or moods  hallucinations  seizures  signs and symptoms of a dangerous change in heartbeat or heart rhythm like chest pain; dizziness; fast, irregular heartbeat; palpitations; feeling faint or lightheaded; falls; breathing problems  suicidal thoughts, mood changes  trouble sleeping Side effects that usually do not require medical attention (report these to your doctor or health care professional if they continue or are bothersome):  dry mouth  headache  muscle pain  nausea  tiredness This list may not describe all possible side effects. Call your doctor for medical advice about side effects. You may report side effects to FDA at 1-800-FDA-1088. Where should I keep my medicine? Keep out of the reach of children. Store at room temperature between 20 and 25 degrees C (68 and 77 degrees F). Throw away any unused medicine after the expiration date. NOTE: This sheet is a summary. It may not cover all possible information. If you have questions about this medicine, talk to your doctor, pharmacist, or health care provider.  2020 Elsevier/Gold Standard (2019-08-06 17:00:35)

## 2020-09-28 ENCOUNTER — Encounter: Payer: Self-pay | Admitting: Adult Health

## 2020-10-01 ENCOUNTER — Encounter: Payer: Self-pay | Admitting: Family Medicine

## 2020-10-02 ENCOUNTER — Telehealth: Payer: Self-pay | Admitting: Adult Health

## 2020-10-02 NOTE — Telephone Encounter (Signed)
LVM, WAKIX program regarding patient, faxed over a couple of CoverMyMeds key yesterday. Just wanted to make sure you guys did receive that. We also want to check to see if a maintenance drug was intended. Because it did only come over for a 1 month. If you have any questions you can contact [phone].

## 2020-10-02 NOTE — Telephone Encounter (Signed)
Received a response stating that [facility] could not respond with clinical questions. Attempted to start another PA but the first PA responded with clinical questions. Questions have been answered and sent back to [facility] via [email or web address].

## 2020-10-02 NOTE — Telephone Encounter (Signed)
Fax done

## 2020-10-02 NOTE — Telephone Encounter (Signed)
Received a PA request from patient's pharmacy for [facility]. PA was started on [email or web address]. Key is [id number]. Did not receive a question response. Will check back later.

## 2020-10-02 NOTE — Telephone Encounter (Signed)
WAKIX received enrollment form.  Will conduct benefits investiagation and will get back to [place] on PA . Information will be office and include any forms for completion.

## 2020-10-02 NOTE — Telephone Encounter (Signed)
I called pt and relayed that [place] for [employer]ix initiated today.  Determination pending.  Spoke to [employer]akix and will refax enrollment with maintenance prescription on this as well.

## 2020-10-02 NOTE — Telephone Encounter (Signed)
PA has been approved 10/02/20 to 04/02/21.

## 2020-10-02 NOTE — Telephone Encounter (Signed)
Hey sandy please call patient and let her know that I did speak with Dr. Brett Fairy she did not want to start an additional medication while waiting on wakix.  Please reassure her that we will diligently work to get [employer] approved for her.

## 2020-10-02 NOTE — Telephone Encounter (Signed)
Did call pt and relayed per Dr. Brett Fairy that she did want to wait on the [facility] approval.  Pt made aware.  Did not want to split her sunosi.  She will wait on wakix.

## 2020-10-10 ENCOUNTER — Telehealth: Payer: Self-pay | Admitting: *Deleted

## 2020-10-10 NOTE — Telephone Encounter (Signed)
Received fax that pt will be receiving her [facility] shipment from [employer].  They will contact pt regularly to ensure adherence and reill the medication.

## 2020-10-18 ENCOUNTER — Other Ambulatory Visit: Payer: Self-pay | Admitting: Family Medicine

## 2020-10-18 DIAGNOSIS — F411 Generalized anxiety disorder: Secondary | ICD-10-CM

## 2020-10-27 ENCOUNTER — Other Ambulatory Visit: Payer: Self-pay | Admitting: Family Medicine

## 2020-10-27 DIAGNOSIS — F411 Generalized anxiety disorder: Secondary | ICD-10-CM

## 2020-10-27 NOTE — Telephone Encounter (Signed)
Refill request for alprazolam. No longer on med list.

## 2020-10-28 ENCOUNTER — Encounter: Payer: Self-pay | Admitting: Family Medicine

## 2020-10-28 DIAGNOSIS — F411 Generalized anxiety disorder: Secondary | ICD-10-CM

## 2020-10-28 MED ORDER — ESCITALOPRAM OXALATE 20 MG PO TABS
20.0000 mg | ORAL_TABLET | Freq: Every day | ORAL | 0 refills | Status: DC
Start: 2020-10-28 — End: 2021-01-22

## 2020-10-31 ENCOUNTER — Encounter: Payer: Self-pay | Admitting: Adult Health

## 2020-11-21 ENCOUNTER — Telehealth: Payer: Self-pay | Admitting: Adult Health

## 2020-11-21 NOTE — Telephone Encounter (Signed)
Pt called, need instruction on how to wean off of Wakix.  Would like a call from the nurse.

## 2020-11-21 NOTE — Telephone Encounter (Signed)
I called pt and she is experiencing worsening memoory fog, feels awake but fatigues, (similar se when was on xyrem).  She is on the 17.8mg  daily of WAKIX.  She noted that after first 2 days of when she started she could feel side effects.  Please advise.  Wants to taper off.

## 2020-11-22 MED ORDER — WAKIX 4.45 MG PO TABS: ORAL_TABLET | ORAL | 0 refills | Status: DC

## 2020-11-22 NOTE — Telephone Encounter (Signed)
Spoke to pt.  She was ok to do this.  Wanted to touch base with pharmacy to let them know.  I called and spoke to [place] at [place] [phone]) relayed side effects pt experiencing. (worsening memory fog, fatigue).  Pt taking since end December.  receommended not cutting in half, as no studies done on that.  New order for 8.9 (2 tabs of 4.45mg ) called to [employer] at [facility]. Pt will try 8.9mg  daily for 2 wks.  LMVM for pt regarding this.  She will call back if questions.

## 2020-11-22 NOTE — Addendum Note (Signed)
Addended by: Brandon Melnick on: 11/22/2020 10:16 AM   Modules accepted: Orders

## 2020-11-22 NOTE — Telephone Encounter (Signed)
I would recommend that she try doing a reduced dose. I recommend 1/2 tablet (8.9 mg).  I would try this for 1 to 2 weeks to see if side effects improve if not then she can stop the medication

## 2020-12-04 ENCOUNTER — Encounter: Payer: Self-pay | Admitting: Adult Health

## 2020-12-04 NOTE — Telephone Encounter (Signed)
This patient has tried multiple medications in the past.  She was recently placed on Wakix but stopped it.  She is now on Sunosi she now wants to go back on high-dose modafinil and Adderall.  I wanted to consult with you because at this point we are changing medications every couple of months

## 2020-12-11 ENCOUNTER — Telehealth: Payer: Self-pay | Admitting: *Deleted

## 2020-12-11 NOTE — Telephone Encounter (Signed)
Called and LMVM for pt that have adverse event form for pt to fill out.  Please return call.

## 2020-12-12 ENCOUNTER — Other Ambulatory Visit: Payer: Self-pay | Admitting: Neurology

## 2020-12-12 NOTE — Telephone Encounter (Signed)
Spoke to pt and she noted some improvement with SE on 10th day of tapering.  She is taking sunosi now.  Is asking for anything else new that she could try.  Please advise.

## 2020-12-12 NOTE — Telephone Encounter (Signed)
Are you ok with her using Modafinil and adderall?

## 2020-12-12 NOTE — Telephone Encounter (Signed)
[place] Thurmond Butts) called, want to know if physician stopping [facility] therapy all together. Contact info: [phone]

## 2020-12-13 MED ORDER — MODAFINIL 200 MG PO TABS: 200.0000 mg | ORAL_TABLET | Freq: Every day | ORAL | 5 refills | Status: DC

## 2020-12-13 MED ORDER — AMPHETAMINE-DEXTROAMPHETAMINE 10 MG PO TABS: 10.0000 mg | ORAL_TABLET | Freq: Every day | ORAL | 0 refills | Status: DC | PRN

## 2020-12-13 NOTE — Telephone Encounter (Signed)
I called and spoke to Tillie Rung.  I relayed pt did stop the wakix after the taper of 2 wk starting around 11-21-2020 of 8.9 daily.  She noted some improvement after 10 days of taper, then stopped.  He will make note of this.  She has discontinued the medication.

## 2020-12-13 NOTE — Telephone Encounter (Signed)
See my chart message

## 2021-01-10 ENCOUNTER — Other Ambulatory Visit: Payer: Self-pay | Admitting: Family Medicine

## 2021-01-10 ENCOUNTER — Encounter: Payer: Self-pay | Admitting: Family Medicine

## 2021-01-10 DIAGNOSIS — F411 Generalized anxiety disorder: Secondary | ICD-10-CM

## 2021-01-22 ENCOUNTER — Encounter: Payer: Self-pay | Admitting: Adult Health

## 2021-01-22 ENCOUNTER — Ambulatory Visit: Payer: BLUE CROSS/BLUE SHIELD | Admitting: Family Medicine

## 2021-01-22 ENCOUNTER — Other Ambulatory Visit: Payer: Self-pay | Admitting: Neurology

## 2021-01-22 ENCOUNTER — Encounter: Payer: Self-pay | Admitting: Family Medicine

## 2021-01-22 ENCOUNTER — Other Ambulatory Visit: Payer: Self-pay | Admitting: Family Medicine

## 2021-01-22 ENCOUNTER — Other Ambulatory Visit: Payer: Self-pay

## 2021-01-22 VITALS — BP 122/76 | HR 86 | Resp 16 | Ht 65.0 in | Wt 146.0 lb

## 2021-01-22 DIAGNOSIS — Z1231 Encounter for screening mammogram for malignant neoplasm of breast: Secondary | ICD-10-CM | POA: Diagnosis not present

## 2021-01-22 DIAGNOSIS — Z1211 Encounter for screening for malignant neoplasm of colon: Secondary | ICD-10-CM | POA: Diagnosis not present

## 2021-01-22 DIAGNOSIS — G47419 Narcolepsy without cataplexy: Secondary | ICD-10-CM

## 2021-01-22 DIAGNOSIS — F411 Generalized anxiety disorder: Secondary | ICD-10-CM

## 2021-01-22 MED ORDER — AMPHETAMINE-DEXTROAMPHETAMINE 10 MG PO TABS: 10.0000 mg | ORAL_TABLET | Freq: Every day | ORAL | 0 refills | Status: DC | PRN

## 2021-01-22 MED ORDER — ESCITALOPRAM OXALATE 20 MG PO TABS: 20.0000 mg | ORAL_TABLET | Freq: Every day | ORAL | 3 refills | Status: DC

## 2021-01-22 MED ORDER — ALPRAZOLAM 0.25 MG PO TBDP: 0.2500 mg | ORAL_TABLET | Freq: Every evening | ORAL | 1 refills | Status: DC | PRN

## 2021-01-22 NOTE — Patient Instructions (Addendum)
Good to see you again today!   Please plan to get a pap at your convenience, and also a mammogram We will send you a Cologuard kit to screen for colon cancer at home We can do blood work for you at your convenience  I do recommend getting your covid 19 series asap!    Take care

## 2021-01-22 NOTE — Progress Notes (Signed)
[place] [facility] [address], [place], [place] [address] [phone]  Date:  01/22/2021   Name:  Elizabeth [name]   DOB:  1976-05-31   MRN:  [id number]  PCP:  Darreld Mclean, MD    Chief Complaint: Medication Refill   History of Present Illness:  Elizabeth [name] is a 45 y.o. very pleasant female patient who presents with the following:  Following up today- history of anxiety, depression, narcolepsy, allergies  I have not seen her in close to 2 years  Her girls are both doing well [name] is taking driver's ed.  Her younger sister Lorane Gell is also thriving  Her narcolepsy is under ok control She is using Sonisi which is "not as strong", but generally does not adequate job controlling her symptoms She also using provigil and also adderall as needed- depending on her schedule She does better during the warmer weather/ when there is more light  We discussed using an artificial sunlight lamp during the winter and she will think about this  Still taking lexapro- she is thinking of trying to do down to 10 mg  She declines labs today Her last mammo was in 2018- will order for her  She is due for a pap- suggested doing this today but she declines for now Due for colon cancer screening, we discussed options.  She prefers Cologuard-I will order a cologuard for her   01/10/2021  01/10/2021   1  Alprazolam Odt 0.25 Mg Tab  10.00  10  Je Cop  [id number]  Nor ([id number])  0/0  0.50 LME  Private Pay  [facility]    01/10/2021  12/13/2020   1  Modafinil 200 Mg Tablet  30.00  30  Me Mil  [id number]  Nor ([id number])  1/5   Comm Ins  [place]    12/29/2020  08/28/2020   1  Sunosi 150 Mg Tablet  30.00  30  Je Cop  [id number]  Nor ([id number])  2/3   Comm Ins  [place]    12/14/2020  12/13/2020   1  Modafinil 200 Mg Tablet  30.00  30  Me Mil  [id number]  [place] ([id number])  0/5   Comm Ins  [place]    12/13/2020  12/13/2020   2  Dextroamp-Amphetamin 10 Mg Tab  30.00  30  Me Mil  [id number]  Nor  ([id number])  0/0   Comm Ins  [place]    12/04/2020  10/27/2020   1  Alprazolam Odt 0.25 Mg Tab  20.00  10  Je Cop  [id number]  [place] ([id number])  1/1  1.00 LME  Private Pay  [place]    11/27/2020  08/28/2020   1  Sunosi 150 Mg Tablet  30.00  30  Je Cop  [id number]  [place] ([id number])  1/3   Comm Ins  [place]    10/28/2020  08/28/2020   1  Sunosi 150 Mg Tablet  30.00  30  Je Cop  [id number]  [place] ([id number])  0/3   Comm Ins      10/27/2020  10/27/2020   1  Alprazolam Odt 0.25 Mg Tab  20.00  10  Je Cop           Patient Active Problem List   Diagnosis Date Noted  . Metatarsalgia of right foot 09/20/2016  . Anxiety state 02/15/2016    Past Medical History:  Diagnosis Date  . Allergy   . Anemia   .  Anxiety state 02/15/2016  . Arthritis   . Depression   . Narcolepsy   . PLMD (periodic limb movement disorder)     Past Surgical History:  Procedure Laterality Date  . CESAREAN SECTION     X2  . DILATION AND CURETTAGE OF UTERUS    . NASAL SEPTUM SURGERY    . ROOT CANAL     x 2    Social History   Tobacco Use  . Smoking status: Former Smoker    Quit date: 10/21/2004    Years since quitting: 16.2  . Smokeless tobacco: Never Used  . Tobacco comment: quit 11 yrs ago  Vaping Use  . Vaping Use: Never used  Substance Use Topics  . Alcohol use: No    Alcohol/week: 0.0 standard drinks  . Drug use: Never    Family History  Problem Relation Age of Onset  . Heart disease Mother   . Depression Mother   . Anxiety disorder Mother   . Diabetes Father   . Depression Father   . Anxiety disorder Father   . Stroke Maternal Grandmother   . Parkinsonism Paternal Grandmother   . Breast cancer Paternal Grandmother   . Bladder Cancer Paternal Grandfather     Allergies  Allergen Reactions  . Sulfa Antibiotics     Medication list has been reviewed and updated.  Current Outpatient Medications on File Prior to Visit  Medication Sig Dispense Refill  . ALPRAZolam (NIRAVAM) 0.25 MG dissolvable tablet Take 1-20 tablets (0.25-5 mg  total) by mouth at bedtime as needed for anxiety. No further refills without office visit 10 tablet 0  . amphetamine-dextroamphetamine (ADDERALL) 10 MG tablet Take 1 tablet (10 mg total) by mouth daily as needed. 30 tablet 0  . escitalopram (LEXAPRO) 20 MG tablet Take 1 tablet (20 mg total) by mouth daily. 30 tablet 0  . meloxicam (MOBIC) 15 MG tablet Take 1 tablet (15 mg total) by mouth daily. As needed for pain 30 tablet 5  . modafinil (PROVIGIL) 200 MG tablet Take 1 tablet (200 mg total) by mouth daily. 30 tablet 5  . Multiple Vitamin (MULTIVITAMIN WITH MINERALS) TABS tablet Take 1 tablet by mouth daily.    . pramipexole (MIRAPEX) 0.5 MG tablet Take by mouth.     No current facility-administered medications on file prior to visit.    Review of Systems:  As per HPI- otherwise negative.   Physical Examination: Vitals:   01/22/21 1437  BP: 122/76  Pulse: 86  Resp: 16  SpO2: 96%   Vitals:   01/22/21 1437  Weight: 146 lb (66.2 kg)  Height: 5\' 5"  (1.651 m)   Body mass index is 24.3 kg/m. Ideal Body Weight: Weight in (lb) to have BMI = 25: 149.9  GEN: no acute distress. HEENT: Atraumatic, Normocephalic.  Ears and Nose: No external deformity. CV: RRR, No M/G/R. No JVD. No thrill. No extra heart sounds. PULM: CTA B, no wheezes, crackles, rhonchi. No retractions. No resp. distress. No accessory muscle use. ABD: S, NT, ND. No rebound. No HSM. EXTR: No c/c/e PSYCH: Normally interactive. Conversant.  Normal weight, looks well  Assessment and Plan: Primary narcolepsy without cataplexy  Anxiety state - Plan: escitalopram (LEXAPRO) 20 MG tablet, ALPRAZolam (NIRAVAM) 0.25 MG dissolvable tablet  Screening for colon cancer  Screening mammogram for breast cancer - Plan: MM 3D SCREEN BREAST BILATERAL   Refilled escitalopram and Lexapro today Ordered Cologuard to screen for colon cancer Ordered mammogram She notes that her narcolepsy symptoms are  under adequate control.   Alprazolam is helpful for sleep at night I encouraged recommended lab work and immunizations, encouraged Pap  This visit occurred during the SARS-CoV-2 public health emergency.  Safety protocols were in place, including screening questions prior to the visit, additional usage of staff PPE, and extensive cleaning of exam room while observing appropriate contact time as indicated for disinfecting solutions.     Signed Lamar Blinks, MD

## 2021-01-25 ENCOUNTER — Telehealth: Payer: Self-pay

## 2021-01-25 ENCOUNTER — Encounter: Payer: Self-pay | Admitting: Family Medicine

## 2021-01-25 NOTE — Telephone Encounter (Signed)
Received fax from pharmacy on patients medication Alprazolam.   Per form patient's insurance does not cover this medication and is requesting an alternative.   [facility].

## 2021-02-03 DIAGNOSIS — Z20822 Contact with and (suspected) exposure to covid-19: Secondary | ICD-10-CM | POA: Diagnosis not present

## 2021-02-23 ENCOUNTER — Other Ambulatory Visit: Payer: Self-pay | Admitting: Family Medicine

## 2021-02-23 MED ORDER — SUNOSI 150 MG PO TABS: ORAL_TABLET | ORAL | 3 refills | Status: DC

## 2021-02-23 NOTE — Telephone Encounter (Signed)
Printed by mistake  

## 2021-02-23 NOTE — Addendum Note (Signed)
Addended by: Lamar Blinks C on: 02/23/2021 03:17 PM   Modules accepted: Orders

## 2021-03-12 ENCOUNTER — Other Ambulatory Visit: Payer: Self-pay | Admitting: Adult Health

## 2021-03-12 ENCOUNTER — Encounter: Payer: Self-pay | Admitting: Adult Health

## 2021-03-12 NOTE — Telephone Encounter (Signed)
This is [place] drug registry for pt:    02/26/2021 02/23/2021 1  Sunosi 150 Mg Tablet 30.00 30 Je Cop [id number] Nor ([id number]) 0/3  Comm Ins [place] 02/23/2021 01/22/2021 1  Alprazolam Odt 0.25 Mg Tab 45.00 30 Je Cop [id number] Nor ([id number]) 1/1 0.75 LME Private Pay [place] 02/22/2021 02/22/2021 3 *  Acetaminophen-Cod #4 Tablet 15.00 5 Su Sim [id number] Car ([id number]) 0/0  Private Pay [place] 02/07/2021 12/13/2020 1  Modafinil 200 Mg Tablet 30.00 30 Me Mil [id number] Nor ([id number]) 2/5  Comm Ins [place] 01/27/2021 01/22/2021 1  Alprazolam Odt 0.25 Mg Tab 45.00 30 Je Cop [id number] Nor ([id number]) 0/1 0.75 LME Private Pay [place] 01/25/2021 08/28/2020 1  Sunosi 150 Mg Tablet 30.00 30 Je Cop [id number] Nor (3235) 0/0  Comm Ins [place] 01/22/2021 01/22/2021 1  Dextroamp-Amphetamin 10 Mg Tab 30.00 30 Me Mil [id number] Nor ([id number]) 0/0  Comm Ins [place] 01/10/2021 12/13/2020 1  Modafinil 200 Mg Tablet 30.00 30 Me Mil [id number] Nor (3235) 1/5  Comm Ins  01/10/2021 01/10/2021 1  Alprazolam Odt 0.25 Mg Tab 10.00 10 Je Cop [id number]

## 2021-03-13 MED ORDER — AMPHETAMINE-DEXTROAMPHETAMINE 10 MG PO TABS: 10.0000 mg | ORAL_TABLET | Freq: Every day | ORAL | 0 refills | Status: DC | PRN

## 2021-03-27 ENCOUNTER — Other Ambulatory Visit: Payer: Self-pay | Admitting: Family Medicine

## 2021-03-27 DIAGNOSIS — F411 Generalized anxiety disorder: Secondary | ICD-10-CM

## 2021-03-28 ENCOUNTER — Encounter: Payer: Self-pay | Admitting: Adult Health

## 2021-03-28 ENCOUNTER — Ambulatory Visit: Payer: BLUE CROSS/BLUE SHIELD | Admitting: Adult Health

## 2021-03-28 VITALS — BP 137/85 | HR 79 | Ht 65.0 in | Wt 144.0 lb

## 2021-03-28 DIAGNOSIS — G47419 Narcolepsy without cataplexy: Secondary | ICD-10-CM

## 2021-03-28 NOTE — Progress Notes (Signed)
PATIENT: Elizabeth [name] DOB: [birth year]  REASON FOR VISIT: follow up HISTORY FROM: patient  HISTORY OF PRESENT ILLNESS: Today 03/28/21:  Elizabeth [name] is a 45 year old female with a history of narcolepsy.  She returns today for follow-up.  She reports that she continues on Sunosi 150 mg daily.  This is being prescribed by Dr. Lorelei Pont.  She states that she takes modafinil 100 mg daily.  She states that there has medications that she will take 2 tablets.  She takes Adderall 10 mg daily but typically only takes this if she has to drive for a long distance or has a busy day.  She states that the combination is working well for her.  She reports that she does note some twitching in her toes throughout the day and at night.  In the past she has been on Mirapex.  Her sleep study did show PLM.  She returns today for an evaluation.  09/27/20: Elizabeth [name] is a 45 year old female with a history of narcolepsy.  She returns today for follow-up.  She was started on Sunosi 150 mg daily.  She reports that this was beneficial initially.  She states that now the medication typically wears off by 3 PM.  She states that sometimes this is dependent on her caffeine intake as well.  She also notices that her sleepiness is triggered by the weather.  She states on a sunny day she feels that she has more energy rather than a cloudy day she feels that she is more tired.  She denies depression.  Returns today for an evaluation.  In the past has tried Adderall, Xyrem, nuvigil, provigil  HISTORY 07/14/2019. Chiefconcernaccording to patient : pt comes today as a Second opinion visit  with Dr Brett Fairy for sleep consult. pt alone, rm 10. pt was diagnosed here 2 yrs ago she has tried and failed Adderall, Xyrem, Nuvigil and currently taking Provigil.  I have the pleasure of seeing Elizabeth [name] today,a 62 -handed White or Caucasian female with a possible sleep disorder. She  has a past medical history of Allergy, Anemia,  Anxiety state (02/15/2016), Arthritis, Depression, Narcolepsy, and PLMD (periodic limb movement disorder)..  She had 2 pregnancies, and has 2 living daughters.  Elizabeth [name] was 2 weeks late, c -section. Elizabeth [name] was a c section at full term.   She had a surgical removal of a neuroma on 6/18-2019.  She is almost choked by tears as she tells me her story.  She also has some chronic back pain sometimes help with Mobic.  She reportedly had some facial tics in 2019 but had suspected that stimulation medication stimulant medication could be the cause.  She was on generic modafinil 2 pills in the morning and 1 pill in the afternoon and was also taking Adderall instead immediate release once daily.  She was seen last by my esteemed partner at [facility]- Dr. Junie Spencer, MD - on 02 June 2018.  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  Medications in the past have all yielded suboptimal results yet. The patient had undergone a sleep study for the diagnostic polysomnography on 7-29 2018 followed by done an M SLT on 05/19/2017 there was no apnea noted lots of periodic limb movements which did not lead to arousals, no hypoxemia.  The study was followed the next day by an M SLT was for naps subjectively the patient reported dreaming and 2 out of the 4 naps and was noted to go into REM  sleep during these 2 naps.  Sleep latencies were brief 5 minutes, 6 minutes and twice at 3 minutes.  Ms. alone indicates a condition of abnormal sleepiness.   Alone the mean sleep onset time with 2  REM sleep onsets is indicative of narcolepsy.  The patient did not have a genetic testing for HLA markers yet.   Reviewing the M SLT worksheet of fifth nap was counseled because 2 of the previously used for naps yielded low sleep latency and to sleep REM onset.  Fam history ; Sleeprelevant medical history: Nocturia times 3, Sleep walking: none, Night terrors; none , other Tonsillectomy none. , no  cervical spine trauma /surgery. Sinus surgery and deviated spetum surgery were post ponded.  Familymedical /sleep history:The patient;s mother and daughter are suffering from  hypersomnia.  Father had sleep terrors, REM BD. She was as sleep walker as a child.   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  Tobacco use; quit -2006 .  ETOH use: glass of wine 5/ week Caffeine intake in form of Coffee( 2 a day) Soda( none ) Tea ( some ) no energy drinks. Regular exercise in form of walking. Yoga.  Can't run as she likes to.    Hobbies : gardening   Sleep habits are as follows: The patient's dinner time is between 8 PM. The patient goes to bed at 10.30 PM and continues to sleep for4- 5 hours hours, wakes for  bathroom breaks, the first time at 2-3 AM.   The preferred sleep position is on left side , with the support of 1 pillow. No longer RLS since mirapex was given.  Dreams are reportedly rare since Lexapro-  Dreams were frequent, vivid and terrifying. Sometimes sleep onset hallucination, dream intrusions, was dreaming during naps. She hears noises, touches ACE inhibitor therapy was not prescribed due to sleep paralysis. She reports drooping things when she gets startled, but no knee buckle, jaw drop.   7 AM is the usual rise time. The patient wakes up spontaneously without an alarm.. She reports not feeling long refreshed or restored in AM. Total sleep time - 7-8 hours.  Naps are taken infrequently, lasting from 15-35  minutes , for which she stets an alarm on her telephone. and are refreshing. Longer naps can be less refreshing.   REVIEW OF SYSTEMS: Out of a complete 14 system review of symptoms, the patient complains only of the following symptoms, and all other reviewed systems are negative.  Epworth sleepiness  score 11 Fatigue severity score 55  ALLERGIES: Allergies  Allergen Reactions  . Sulfa Antibiotics     HOME MEDICATIONS: Outpatient Medications Prior to Visit  Medication Sig Dispense Refill  . ALPRAZolam (NIRAVAM) 0.25 MG dissolvable tablet TAKE 1-2 TABLETS (0.25-5 MG TOTAL) BY MOUTH AT BEDTIME AS NEEDED FOR ANXIETY. 45 tablet 1  . amphetamine-dextroamphetamine (ADDERALL) 10 MG tablet Take 1 tablet (10 mg total) by mouth daily as needed. 30 tablet 0  . escitalopram (LEXAPRO) 20 MG tablet Take 1 tablet (20 mg total) by mouth daily. (Patient taking differently: Take 20 mg by mouth daily. Pt made decision to take 1/2 tab for a week.) 90 tablet 3  . meloxicam (MOBIC) 15 MG tablet Take 1 tablet (15 mg total) by mouth daily. As needed for pain 30 tablet 5  . modafinil (  PROVIGIL) 200 MG tablet Take 1 tablet (200 mg total) by mouth daily. (Patient taking differently: Take 200 mg by mouth as needed.) 30 tablet 5  . Multiple Vitamin (MULTIVITAMIN WITH MINERALS) TABS tablet Take 1 tablet by mouth daily.    . Solriamfetol HCl (SUNOSI) 150 MG TABS TAKE 150 MG BY MOUTH EVERY MORNING. 30 tablet 3  . pramipexole (MIRAPEX) 0.5 MG tablet Take by mouth. (Patient not taking: Reported on 03/28/2021)     No facility-administered medications prior to visit.    PAST MEDICAL HISTORY: Past Medical History:  Diagnosis Date  . Allergy   . Anemia   . Anxiety state 02/15/2016  . Arthritis   . Depression   . Narcolepsy   . PLMD (periodic limb movement disorder)     PAST SURGICAL HISTORY: Past Surgical History:  Procedure Laterality Date  . CESAREAN SECTION     X2  . DILATION AND CURETTAGE OF UTERUS    . NASAL SEPTUM SURGERY    . ROOT CANAL     x 2    FAMILY HISTORY: Family History  Problem Relation Age of Onset  . Heart disease Mother   . Depression Mother   . Anxiety disorder Mother   . Diabetes Father   . Depression Father   . Anxiety disorder Father   . Stroke Maternal Grandmother   .  Parkinsonism Paternal Grandmother   . Breast cancer Paternal Grandmother   . Bladder Cancer Paternal Grandfather     SOCIAL HISTORY: Social History   Socioeconomic History  . Marital status: Married    Spouse name: Not on file  . Number of children: 2  . Years of education: Masters  . Highest education level: Not on file  Occupational History  . Not on file  Tobacco Use  . Smoking status: Former Smoker    Quit date: 10/21/2004    Years since quitting: 16.4  . Smokeless tobacco: Never Used  . Tobacco comment: quit 11 yrs ago  Vaping Use  . Vaping Use: Never used  Substance and Sexual Activity  . Alcohol use: No    Alcohol/week: 0.0 standard drinks  . Drug use: Never  . Sexual activity: Yes    Birth control/protection: Surgical  Other Topics Concern  . Not on file  Social History Narrative   2 caffeine drinks a day    Social Determinants of Health   Financial Resource Strain: Not on file  Food Insecurity: Not on file  Transportation Needs: Not on file  Physical Activity: Not on file  Stress: Not on file  Social Connections: Not on file  Intimate Partner Violence: Not on file      PHYSICAL EXAM  Vitals:   03/28/21 1420  BP: 137/85  Pulse: 79  Weight: 144 lb (65.3 kg)  Height: 5' 5"  (1.651 m)   Body mass index is 23.96 kg/m.  Generalized: Well developed, in no acute distress   Neurological examination  Mentation: Alert oriented to time, place, history taking. Follows all commands speech and language fluent Cranial nerve II-XII: Pupils were equal round reactive to light. Extraocular movements were full, visual field were full on confrontational test, Head turning and shoulder shrug  were normal and symmetric. Motor: The motor testing reveals 5 over 5 strength of all 4 extremities. Good symmetric motor tone is noted throughout.  Sensory: Sensory testing is intact to soft touch on all 4 extremities. No evidence of extinction is noted.  Coordination:  Cerebellar testing reveals good finger-nose-finger and  heel-to-shin bilaterally.  Gait and station: Gait is normal.  Reflexes: Deep tendon reflexes are symmetric and normal bilaterally.   DIAGNOSTIC DATA (LABS, IMAGING, TESTING) - I reviewed patient records, labs, notes, testing and imaging myself where available.  Lab Results  Component Value Date   WBC 4.4 11/10/2018   HGB 13.0 11/10/2018   HCT 38.8 11/10/2018   MCV 91.1 11/10/2018   PLT 302.0 11/10/2018      Component Value Date/Time   NA 139 11/10/2018 1123   K 4.7 11/10/2018 1123   CL 102 11/10/2018 1123   CO2 32 11/10/2018 1123   GLUCOSE 81 11/10/2018 1123   BUN 11 11/10/2018 1123   CREATININE 0.66 11/10/2018 1123   CALCIUM 9.4 11/10/2018 1123   PROT 7.3 11/10/2018 1123   ALBUMIN 4.6 11/10/2018 1123   AST 14 11/10/2018 1123   ALT 9 11/10/2018 1123   ALKPHOS 48 11/10/2018 1123   BILITOT 0.3 11/10/2018 1123   Lab Results  Component Value Date   CHOL 163 07/22/2018   HDL 70.10 07/22/2018   LDLCALC 82 07/22/2018   TRIG 54.0 07/22/2018   CHOLHDL 2 07/22/2018   Lab Results  Component Value Date   HGBA1C 5.3 07/22/2018   No results found for: BLTGAIDK22 Lab Results  Component Value Date   TSH 1.92 11/10/2018      ASSESSMENT AND PLAN 45 y.o. year old female  has a past medical history of Allergy, Anemia, Anxiety state (02/15/2016), Arthritis, Depression, Narcolepsy, and PLMD (periodic limb movement disorder). here with :  1.  Narcolepsy   -- Continue Modafinil 100 mg daily.  -- Continue Adderall 10 mg daily PRN --Continue Sunosi 150 mg daily currently prescribed by Dr. Jackie Plum --Encouraged patient to take the medication as prescribed and try to be consistent with her medication.  She voiced understanding. --Follow-up in 6 months or sooner if needed    Ward Givens, MSN, NP-C 03/28/2021, 2:25 PM [facility] [address], [place], [place] [address] [phone]

## 2021-03-28 NOTE — Patient Instructions (Signed)
Your Plan:  Continue Modafinil 100 mg and Adderall 10 mg  Continue sunosi prescribed by Dr. Edilia Bo If your symptoms worsen or you develop new symptoms please let us know.    Thank you for coming to see [place] at [facility]. I hope we have been able to provide you high quality care today.  You may receive a patient satisfaction survey over the next few weeks. We would appreciate your feedback and comments so that we may continue to improve ourselves and the health of our patients.

## 2021-04-24 ENCOUNTER — Other Ambulatory Visit: Payer: Self-pay | Admitting: Family Medicine

## 2021-04-30 ENCOUNTER — Encounter: Payer: Self-pay | Admitting: Adult Health

## 2021-04-30 ENCOUNTER — Encounter: Payer: Self-pay | Admitting: Family Medicine

## 2021-04-30 MED ORDER — ALPRAZOLAM 0.25 MG PO TABS: 0.2500 mg | ORAL_TABLET | Freq: Two times a day (BID) | ORAL | 0 refills | Status: DC | PRN

## 2021-04-30 MED ORDER — AMPHETAMINE-DEXTROAMPHETAMINE 10 MG PO TABS: 10.0000 mg | ORAL_TABLET | Freq: Every day | ORAL | 0 refills | Status: DC | PRN

## 2021-04-30 NOTE — Telephone Encounter (Signed)
Per [place] registry, last filled on 04/09/2021 Modafinil 200 Mg Tablet #30/30. Original rx was 12/13/20. I called the pt's pharmacy to make sure she could still refill again. Pt will be due for refill as early as 05/07/21. I was told by the pharmacist they will have the patient's refill ready to fill on 05/07/21. She will be able to fill it once more before it expires on 06/11/21.

## 2021-04-30 NOTE — Addendum Note (Signed)
Addended by: Gildardo Griffes on: 04/30/2021 04:12 PM   Modules accepted: Orders

## 2021-04-30 NOTE — Telephone Encounter (Signed)
Per [place] registry, last filled on 03/13/21 Dextroamp-Amphetamin 10 Mg Tab #30/30. Refill sent to [facility] NP to e-scribe.

## 2021-05-21 ENCOUNTER — Encounter: Payer: Self-pay | Admitting: Family Medicine

## 2021-06-05 ENCOUNTER — Other Ambulatory Visit: Payer: Self-pay | Admitting: Adult Health

## 2021-06-05 NOTE — Telephone Encounter (Signed)
Last OV: 03/28/21 Next OV: 09/26/21  [place] Registry Last fill: 05/07/21 #30 (30 day supply)

## 2021-06-21 ENCOUNTER — Encounter: Payer: Self-pay | Admitting: Family Medicine

## 2021-06-21 ENCOUNTER — Other Ambulatory Visit: Payer: Self-pay | Admitting: Family Medicine

## 2021-06-21 ENCOUNTER — Encounter: Payer: Self-pay | Admitting: Adult Health

## 2021-06-22 ENCOUNTER — Encounter: Payer: Self-pay | Admitting: Family

## 2021-06-22 ENCOUNTER — Other Ambulatory Visit: Payer: Self-pay | Admitting: Family

## 2021-06-22 ENCOUNTER — Encounter: Payer: Self-pay | Admitting: Family Medicine

## 2021-06-22 ENCOUNTER — Other Ambulatory Visit: Payer: Self-pay | Admitting: Family Medicine

## 2021-06-22 MED ORDER — SUNOSI 150 MG PO TABS: ORAL_TABLET | ORAL | 0 refills | Status: DC

## 2021-06-22 MED ORDER — SUNOSI 150 MG PO TABS: ORAL_TABLET | ORAL | 5 refills | Status: DC

## 2021-06-22 NOTE — Telephone Encounter (Signed)
Could you please sign order. It would not send electronically for me. Rx printed in error.

## 2021-07-05 ENCOUNTER — Other Ambulatory Visit: Payer: Self-pay | Admitting: Family Medicine

## 2021-07-05 NOTE — Telephone Encounter (Signed)
Requesting: xanax Contract: none UDS: none Last Visit: 01/22/21 Next Visit: none pending Last Refill: 04/30/21

## 2021-07-18 ENCOUNTER — Encounter: Payer: Self-pay | Admitting: Family Medicine

## 2021-08-16 ENCOUNTER — Encounter: Payer: Self-pay | Admitting: Family Medicine

## 2021-08-16 ENCOUNTER — Other Ambulatory Visit: Payer: Self-pay | Admitting: Family Medicine

## 2021-08-16 NOTE — Telephone Encounter (Signed)
Okay for refill?  

## 2021-09-02 NOTE — Progress Notes (Deleted)
[place] at [facility] [address], [place], [place] [address] [phone]  Date:  09/03/2021   Name:  Elizabeth [name]   DOB:  [birth year]   MRN:  [id number]  PCP:  Darreld Mclean, MD    Chief Complaint: No chief complaint on file.   History of Present Illness:  Elizabeth [name] is a 45 y.o. very pleasant female patient who presents with the following:  Pt seen today for medication follow-up History of anxiety, depression and narcolepsy  Last seen by myself in April- from our visit at that time: Her narcolepsy is under ok control She is using Sonisi which is "not as strong", but generally does an adequate job controlling her symptoms She also using provigil and also adderall as needed- depending on her schedule She does better during the warmer weather/ when there is more light  We discussed using an artificial sunlight lamp during the winter and she will think about this Still taking lexapro- she is thinking of trying to do down to 10 mg  She declines labs today Her last mammo was in 2018- will order for her  She is due for a pap- suggested doing this today but she declines for now Due for colon cancer screening, we discussed options.  She prefers Cologuard-I will order a cologuard for her   ? Cologuard  Mammo 2018 Pap 2018 Patient Active Problem List   Diagnosis Date Noted   Metatarsalgia of right foot 09/20/2016   Anxiety state 02/15/2016    Past Medical History:  Diagnosis Date   Allergy    Anemia    Anxiety state 02/15/2016   Arthritis    Depression    Narcolepsy    PLMD (periodic limb movement disorder)     Past Surgical History:  Procedure Laterality Date   CESAREAN SECTION     X2   DILATION AND CURETTAGE OF UTERUS     NASAL SEPTUM SURGERY     ROOT CANAL     x 2    Social History   Tobacco Use   Smoking status: Former    Types: Cigarettes    Quit date: 10/21/2004    Years since quitting: 16.8    Smokeless tobacco: Never   Tobacco comments:    quit 11 yrs ago  Vaping Use   Vaping Use: Never used  Substance Use Topics   Alcohol use: No    Alcohol/week: 0.0 standard drinks   Drug use: Never    Family History  Problem Relation Age of Onset   Heart disease Mother    Depression Mother    Anxiety disorder Mother    Diabetes Father    Depression Father    Anxiety disorder Father    Stroke Maternal Grandmother    Parkinsonism Paternal Grandmother    Breast cancer Paternal Grandmother    Bladder Cancer Paternal Grandfather     Allergies  Allergen Reactions   Sulfa Antibiotics     Medication list has been reviewed and updated.  Current Outpatient Medications on File Prior to Visit  Medication Sig Dispense Refill   ALPRAZolam (XANAX) 0.25 MG tablet TAKE 1 TABLET (0.25 MG TOTAL) BY MOUTH 2 (TWO) TIMES DAILY AS NEEDED FOR ANXIETY. TIME FOR OFFICE VISIT PLEASE 45 tablet 0   amphetamine-dextroamphetamine (ADDERALL) 10 MG tablet Take 1 tablet (10 mg total) by mouth daily as needed. 30 tablet 0   escitalopram (LEXAPRO) 20 MG tablet Take 1 tablet (  20 mg total) by mouth daily. (Patient taking differently: Take 20 mg by mouth daily. Pt made decision to take 1/2 tab for a week.) 90 tablet 3   meloxicam (MOBIC) 15 MG tablet TAKE 1 TABLET (15 MG TOTAL) BY MOUTH DAILY. AS NEEDED FOR PAIN 30 tablet 5   modafinil (PROVIGIL) 200 MG tablet TAKE 1 TABLET BY MOUTH EVERY DAY 30 tablet 5   Multiple Vitamin (MULTIVITAMIN WITH MINERALS) TABS tablet Take 1 tablet by mouth daily.     pramipexole (MIRAPEX) 0.5 MG tablet Take by mouth. (Patient not taking: Reported on 03/28/2021)     Solriamfetol HCl (SUNOSI) 150 MG TABS Take one by mouth daily 30 tablet 0   No current facility-administered medications on file prior to visit.    Review of Systems:  As per HPI- otherwise negative.   Physical Examination: There were no vitals filed for this visit. There were no vitals filed for this  visit. There is no height or weight on file to calculate BMI. Ideal Body Weight:    GEN: no acute distress. HEENT: Atraumatic, Normocephalic.  Ears and Nose: No external deformity. CV: RRR, No M/G/R. No JVD. No thrill. No extra heart sounds. PULM: CTA B, no wheezes, crackles, rhonchi. No retractions. No resp. distress. No accessory muscle use. ABD: S, NT, ND, +BS. No rebound. No HSM. EXTR: No c/c/e PSYCH: Normally interactive. Conversant.    Assessment and Plan: ***  Signed Lamar Blinks, MD

## 2021-09-03 ENCOUNTER — Ambulatory Visit: Payer: BLUE CROSS/BLUE SHIELD | Admitting: Family Medicine

## 2021-09-03 DIAGNOSIS — F411 Generalized anxiety disorder: Secondary | ICD-10-CM

## 2021-09-03 DIAGNOSIS — G47419 Narcolepsy without cataplexy: Secondary | ICD-10-CM

## 2021-09-23 ENCOUNTER — Other Ambulatory Visit: Payer: Self-pay | Admitting: Family Medicine

## 2021-09-24 ENCOUNTER — Encounter: Payer: Self-pay | Admitting: Family Medicine

## 2021-09-26 ENCOUNTER — Ambulatory Visit: Payer: BLUE CROSS/BLUE SHIELD | Admitting: Neurology

## 2021-09-26 ENCOUNTER — Other Ambulatory Visit: Payer: Self-pay

## 2021-09-26 VITALS — BP 119/70 | HR 78 | Ht 65.0 in | Wt 147.5 lb

## 2021-09-26 DIAGNOSIS — G2581 Restless legs syndrome: Secondary | ICD-10-CM | POA: Diagnosis not present

## 2021-09-26 DIAGNOSIS — G47429 Narcolepsy in conditions classified elsewhere without cataplexy: Secondary | ICD-10-CM | POA: Diagnosis not present

## 2021-09-26 NOTE — Progress Notes (Signed)
PATIENT: Elizabeth [name] DOB: [birth year]  REASON FOR VISIT: follow up HISTORY FROM: patient  HISTORY OF PRESENT ILLNESS: Interval history : 09/26/21:  " My brain is racing, I have some insomnia, and I have some RLS/ PLM. I am doing Sunosi  daily and Modafinil prn now. Adderall was making me cranky, snappy plus I experienced a drop off after the dose wore off. " Wants to discuss getting back on mirapex, she stopped this d/t potential interaction w/ lexapro but she is no longer on lexapro.    MM:  Elizabeth [name] is a 45 year old female with a history of narcolepsy.  She returns today for follow-up.  She reports that she continues on Sunosi 150 mg daily.  This is being prescribed by Dr. Lorelei Pont.  She states that she takes modafinil 100 mg daily.  She states that there has medications that she will take 2 tablets.  She takes Adderall 10 mg daily but typically only takes this if she has to drive for a long distance or has a busy day.  She states that the combination is working well for her.  She reports that she does note some twitching in her toes throughout the day and at night.  In the past she has been on Mirapex.  Her sleep study did show PLM.  She returns today for an evaluation.  09/27/20: Elizabeth [name] is a 45 year old female with a history of narcolepsy.  She returns today for follow-up.  She was started on Sunosi 150 mg daily.  She reports that this was beneficial initially.  She states that now the medication typically wears off by 3 PM.  She states that sometimes this is dependent on her caffeine intake as well.  She also notices that her sleepiness is triggered by the weather.  She states on a sunny day she feels that she has more energy rather than a cloudy day she feels that she is more tired.  She denies depression.  Returns today for an evaluation.  In the past has tried Adderall, Xyrem, nuvigil, provigil  HISTORY 07/14/2019. Chief concern according to patient : pt comes today as a  Second opinion visit  with Dr Brett Fairy for sleep consult. pt alone, rm 10. pt was diagnosed here 2 yrs ago she has tried and failed Adderall, Xyrem, Nuvigil and currently taking Provigil.    I have the pleasure of seeing Elizabeth [name] today, a 34 -handed White or Caucasian female with a possible sleep disorder.  She  has a past medical history of Allergy, Anemia, Anxiety state (02/15/2016), Arthritis, Depression, Narcolepsy, and PLMD (periodic limb movement disorder)..  She had 2 pregnancies, and has 2 living daughters.  Elizabeth [name] was 2 weeks late, c -section. Elizabeth [name] was a c section at full term.   She had a surgical removal of a neuroma on 6/18-2019.  She is almost choked by tears as she tells me her story.  She also has some chronic back pain sometimes help with Mobic.  She reportedly had some facial tics in 2019 but had suspected that stimulation medication stimulant medication could be the cause.  She was on generic modafinil 2 pills in the morning and 1 pill in the afternoon and was also taking Adderall instead immediate release once daily.  She was seen last by my esteemed partner at [facility]- Dr. Junie Spencer, MD - on 02 June 2018.  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  Medications in the past have all  yielded suboptimal results yet. The patient had undergone a sleep study for the diagnostic polysomnography on 7-29 2018 followed by done an M SLT on 05/19/2017 there was no apnea noted lots of periodic limb movements which did not lead to arousals, no hypoxemia.  The study was followed the next day by an M SLT was for naps subjectively the patient reported dreaming and 2 out of the 4 naps and was noted to go into REM sleep during these 2 naps.  Sleep latencies were brief 5 minutes, 6 minutes and twice at 3 minutes.  Ms. alone indicates a condition of abnormal sleepiness.   Alone the mean sleep onset time with 2  REM sleep onsets is indicative of  narcolepsy.  The patient did not have a genetic testing for HLA markers yet.   Reviewing the M SLT worksheet of fifth nap was counseled because 2 of the previously used for naps yielded low sleep latency and to sleep REM onset.   Fam history ; Sleep relevant medical history: Nocturia times 3, Sleep walking: none, Night terrors; none , other Tonsillectomy none. , no cervical spine trauma /surgery. Sinus surgery and deviated spetum surgery were post ponded.   Family medical /sleep history:The patient;s mother and daughter are suffering from  hypersomnia.  Father had sleep terrors, REM BD. She was as sleep walker as a child.    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  Tobacco use; quit -2006 .   ETOH use: glass of wine 5/ week Caffeine intake in form of Coffee( 2 a day) Soda( none ) Tea ( some ) no energy drinks. Regular exercise in form of walking. Yoga.  Can't run as she likes to.    Hobbies : gardening     Sleep habits are as follows:  The patient's dinner time is between 8 PM. The patient goes to bed at 10.30 PM and continues to sleep for4- 5 hours hours, wakes for  bathroom breaks, the first time at 2-3 AM.   The preferred sleep position is on left side , with the support of 1 pillow. No longer RLS since mirapex was given.  Dreams are reportedly rare since Lexapro-  Dreams were frequent, vivid and terrifying. Sometimes sleep onset hallucination, dream intrusions, was dreaming during naps. She hears noises, touches ACE inhibitor therapy was not prescribed due to sleep paralysis. She reports drooping things when she gets startled, but no knee buckle, jaw drop.    7 AM is the usual rise time. The patient wakes up spontaneously without an alarm.. She reports not feeling long refreshed or restored in AM. Total sleep time  - 7-8 hours.  Naps are taken infrequently, lasting from 15-35  minutes , for which she stets an alarm on her telephone. and are refreshing. Longer naps can be less refreshing.   REVIEW OF SYSTEMS: Out of a complete 14 system review of symptoms, the patient complains only of the following symptoms, and all other reviewed systems are negative.  Epworth sleepiness score 11 Fatigue severity score 55  ALLERGIES: Allergies  Allergen Reactions   Sulfa Antibiotics     HOME MEDICATIONS: Outpatient Medications Prior to Visit  Medication Sig Dispense Refill   ALPRAZolam (XANAX) 0.25 MG tablet TAKE 1 TABLET BY MOUTH 2 TIMES DAILY AS NEEDED FOR ANXIETY. TIME  FOR OFFICE VISIT PLEASE 45 tablet 0   modafinil (PROVIGIL) 200 MG tablet TAKE 1 TABLET BY MOUTH EVERY DAY 30 tablet 5   Multiple Vitamin (MULTIVITAMIN WITH MINERALS) TABS tablet Take 1 tablet by mouth daily.     Solriamfetol HCl (SUNOSI) 150 MG TABS Take one by mouth daily 30 tablet 0   pramipexole (MIRAPEX) 0.5 MG tablet Take by mouth. (Patient not taking: Reported on 03/28/2021)     amphetamine-dextroamphetamine (ADDERALL) 10 MG tablet Take 1 tablet (10 mg total) by mouth daily as needed. 30 tablet 0   escitalopram (LEXAPRO) 20 MG tablet Take 1 tablet (20 mg total) by mouth daily. (Patient taking differently: Take 20 mg by mouth daily. Pt made decision to take 1/2 tab for a week.) 90 tablet 3   meloxicam (MOBIC) 15 MG tablet TAKE 1 TABLET (15 MG TOTAL) BY MOUTH DAILY. AS NEEDED FOR PAIN 30 tablet 5   No facility-administered medications prior to visit.    PAST MEDICAL HISTORY: Past Medical History:  Diagnosis Date   Allergy    Anemia    Anxiety state 02/15/2016   Arthritis    Depression    Narcolepsy    PLMD (periodic limb movement disorder)     PAST SURGICAL HISTORY: Past Surgical History:  Procedure Laterality Date   CESAREAN SECTION     X2   DILATION AND CURETTAGE OF UTERUS     NASAL SEPTUM SURGERY     ROOT CANAL     x 2     FAMILY HISTORY: Family History  Problem Relation Age of Onset   Heart disease Mother    Depression Mother    Anxiety disorder Mother    Diabetes Father    Depression Father    Anxiety disorder Father    Stroke Maternal Grandmother    Parkinsonism Paternal Grandmother    Breast cancer Paternal Grandmother    Bladder Cancer Paternal Grandfather     SOCIAL HISTORY: Social History   Socioeconomic History   Marital status: Married    Spouse name: Not on file   Number of children: 2   Years of education: Masters   Highest education level: Not on file  Occupational History   Not on file  Tobacco Use   Smoking status: Former    Types: Cigarettes    Quit date: 10/21/2004    Years since quitting: 16.9   Smokeless tobacco: Never   Tobacco comments:    quit 11 yrs ago  Vaping Use   Vaping Use: Never used  Substance and Sexual Activity   Alcohol use: No    Alcohol/week: 0.0 standard drinks   Drug use: Never   Sexual activity: Yes    Birth control/protection: Surgical  Other Topics Concern   Not on file  Social History Narrative   2 caffeine drinks a day    Social Determinants of Health   Financial Resource Strain: Not on file  Food Insecurity: Not on file  Transportation Needs: Not on file  Physical Activity: Not on file  Stress: Not on file  Social Connections: Not on file  Intimate Partner Violence: Not on file      PHYSICAL EXAM  Vitals:   09/26/21 1419  BP: 119/70  Pulse: 78  SpO2: 97%  Weight: 147 lb 8 oz (66.9 kg)  Height: $Remove'5\' 5"'DQcfgto$  (1.651 m)   Body mass index is 24.55 kg/m.  Generalized: Well developed, in no acute distress   Neurological examination  Mentation: Alert oriented to time,  place, history taking. Follows all commands speech and language fluent Cranial nerve II-XII: Pupils were equal round reactive to light. Extraocular movements were full, visual field were full on confrontational test, Head turning and shoulder shrug  were normal and  symmetric. Motor: The motor testing reveals 5 over 5 strength of all 4 extremities. Good symmetric motor tone is noted throughout.  Sensory: Sensory testing is intact to soft touch on all 4 extremities. No evidence of extinction is noted.  Coordination: Cerebellar testing reveals good finger-nose-finger and heel-to-shin bilaterally.  Gait and station: Gait is normal.  Reflexes: Deep tendon reflexes are symmetric and normal bilaterally.   DIAGNOSTIC DATA (LABS, IMAGING, TESTING) - I reviewed patient records, labs, notes, testing and imaging myself where available.  Lab Results  Component Value Date   WBC 4.4 11/10/2018   HGB 13.0 11/10/2018   HCT 38.8 11/10/2018   MCV 91.1 11/10/2018   PLT 302.0 11/10/2018      Component Value Date/Time   NA 139 11/10/2018 1123   K 4.7 11/10/2018 1123   CL 102 11/10/2018 1123   CO2 32 11/10/2018 1123   GLUCOSE 81 11/10/2018 1123   BUN 11 11/10/2018 1123   CREATININE 0.66 11/10/2018 1123   CALCIUM 9.4 11/10/2018 1123   PROT 7.3 11/10/2018 1123   ALBUMIN 4.6 11/10/2018 1123   AST 14 11/10/2018 1123   ALT 9 11/10/2018 1123   ALKPHOS 48 11/10/2018 1123   BILITOT 0.3 11/10/2018 1123   Lab Results  Component Value Date   CHOL 163 07/22/2018   HDL 70.10 07/22/2018   LDLCALC 82 07/22/2018   TRIG 54.0 07/22/2018   CHOLHDL 2 07/22/2018   Lab Results  Component Value Date   HGBA1C 5.3 07/22/2018   No results found for: [id number] Lab Results  Component Value Date   TSH 1.92 11/10/2018      ASSESSMENT AND PLAN 45 y.o. year old female  has a past medical history of Allergy, Anemia, Anxiety state (02/15/2016), Arthritis, Depression, Narcolepsy, and PLMD (periodic limb movement disorder). here with :  6 month f/u. Wants to discuss getting back on mirapex, she stopped this d/t potential interaction w/ lexapro but she is no longer on lexapro.   1.  Narcolepsy without Cataplexy. MSLT positive for short sleep latency and 2 SREM onsets.    --  Continue Modafinil 100 mg daily prn  -- Continue Adderall 10 mg daily PRN --Continue Sunosi 150 mg daily currently prescribed by Dr. Deatra Ina-  RLS _ restarting Mirapex.   --Encouraged patient to take the medication as prescribed and try to be consistent with her medication.  She voiced understanding. --Follow-up in 6 months or sooner if needed    Larey Seat, MD  09/26/2021, 2:57 PM [facility] [address] [place], [place] [address] [phone]

## 2021-09-27 NOTE — Progress Notes (Addendum)
[place] at [employer] [place], [place], [place] [address] [phone]  Date:  10/01/2021   Name:  Elizabeth [name]   DOB:  1976/03/11   MRN:  [id number]  PCP:  Elizabeth Mclean, MD    Chief Complaint: Medication Management (Concerns/ questions: pt was seen at a [facility] today, she was unaware that we were seeing sick pts. /Flu shot today: not yet//)   History of Present Illness:  Elizabeth [name] is a 45 y.o. very pleasant female patient who presents with the following:  Patient seen today for follow-up and medication review Last visit with myself 4/22  Elizabeth [name] suffers from narcolepsy as well as anxiety Narcolepsy treated with Provigil and Sunosi, she also uses alprazolam 0.5 twice daily  Most recent labs January 2020, need to update at minimum renal function today She is also behind on several screening services and immunizations   She was seen at an UC earlier today with illness- not thought to be anything serious   She saw her sleep doctor - Dohmeier- last week  I encouraged her to get a mammogram - she continues to decline this service Colon cancer screening - no family history.  She would prefer to do Cologuard, I ordered this for her today Pt declines pap today   Patient Active Problem List   Diagnosis Date Noted   Metatarsalgia of right foot 09/20/2016   Anxiety state 02/15/2016    Past Medical History:  Diagnosis Date   Allergy    Anemia    Anxiety state 02/15/2016   Arthritis    Depression    Narcolepsy    PLMD (periodic limb movement disorder)     Past Surgical History:  Procedure Laterality Date   CESAREAN SECTION     X2   DILATION AND CURETTAGE OF UTERUS     NASAL SEPTUM SURGERY     ROOT CANAL     x 2    Social History   Tobacco Use   Smoking status: Former    Types: Cigarettes    Quit date: 10/21/2004    Years since quitting: 16.9   Smokeless tobacco: Never   Tobacco comments:     quit 11 yrs ago  Vaping Use   Vaping Use: Never used  Substance Use Topics   Alcohol use: No    Alcohol/week: 0.0 standard drinks   Drug use: Never    Family History  Problem Relation Age of Onset   Heart disease Mother    Depression Mother    Anxiety disorder Mother    Diabetes Father    Depression Father    Anxiety disorder Father    Stroke Maternal Grandmother    Parkinsonism Paternal Grandmother    Breast cancer Paternal Grandmother    Bladder Cancer Paternal Grandfather     Allergies  Allergen Reactions   Sulfa Antibiotics     Medication list has been reviewed and updated.  Current Outpatient Medications on File Prior to Visit  Medication Sig Dispense Refill   ALPRAZolam (XANAX) 0.25 MG tablet TAKE 1 TABLET BY MOUTH 2 TIMES DAILY AS NEEDED FOR ANXIETY. TIME FOR OFFICE VISIT PLEASE 45 tablet 0   benzonatate (TESSALON) 200 MG capsule Take by mouth.     fexofenadine-pseudoephedrine (ALLEGRA-D 24) 180-240 MG 24 hr tablet Take by mouth.     fluticasone (FLONASE) 50 MCG/ACT nasal spray Place into the nose.     modafinil (PROVIGIL) 200 MG tablet  TAKE 1 TABLET BY MOUTH EVERY DAY 30 tablet 5   Multiple Vitamin (MULTIVITAMIN WITH MINERALS) TABS tablet Take 1 tablet by mouth daily.     pramipexole (MIRAPEX) 0.5 MG tablet Take by mouth.     predniSONE (DELTASONE) 20 MG tablet Take by mouth.     Solriamfetol HCl (SUNOSI) 150 MG TABS Take one by mouth daily 30 tablet 0   No current facility-administered medications on file prior to visit.    Review of Systems:  As per HPI- otherwise negative.   Physical Examination: Vitals:   10/01/21 1358  BP: 134/80  Pulse: 88  Resp: 18  Temp: 98.2 F (36.8 C)  SpO2: 98%   Vitals:   10/01/21 1358  Weight: 146 lb 3.2 oz (66.3 kg)  Height: 5\' 5"  (1.651 m)   Body mass index is 24.33 kg/m. Ideal Body Weight: Weight in (lb) to have BMI = 25: 149.9  GEN: no acute distress.  Normal weight, looks well HEENT: Atraumatic,  Normocephalic.  Ears and Nose: No external deformity. CV: RRR, No M/G/R. No JVD. No thrill. No extra heart sounds. PULM: CTA B, no wheezes, crackles, rhonchi. No retractions. No resp. distress. No accessory muscle use. ABD: S, NT, ND, +BS. No rebound. No HSM. EXTR: No c/c/e PSYCH: Normally interactive. Conversant.    Assessment and Plan: Primary narcolepsy without cataplexy  Fatigue, unspecified type - Plan: TSH, VITAMIN D 25 Hydroxy (Vit-D Deficiency, Fractures)  Screening for hyperlipidemia - Plan: Lipid panel  Screening for diabetes mellitus - Plan: Comprehensive metabolic panel, Hemoglobin A1c  Screening, deficiency anemia, iron - Plan: CBC  Screening for colon cancer - Plan: Cologuard  Patient seen today for follow-up, medication monitor.  Her narcolepsy is treated as above-continue Provigil and Sunosi  Labs are pending as above  Encouraged screening for breast cancer and cervical cancer, encourage routine immunizations  Order Cologuard  Signed Lamar Blinks, MD  Addendum 12/13, received her labs as below.  Message to patient  Results for orders placed or performed in visit on 10/01/21  CBC  Result Value Ref Range   WBC 5.0 4.0 - 10.5 K/uL   RBC 4.23 3.87 - 5.11 Mil/uL   Platelets 292.0 150.0 - 400.0 K/uL   Hemoglobin 12.8 12.0 - 15.0 g/dL   HCT 38.8 36.0 - 46.0 %   MCV 91.7 78.0 - 100.0 fl   MCHC 33.0 30.0 - 36.0 g/dL   RDW 14.0 11.5 - 15.5 %  Comprehensive metabolic panel  Result Value Ref Range   Sodium 138 135 - 145 mEq/L   Potassium 4.2 3.5 - 5.1 mEq/L   Chloride 100 96 - 112 mEq/L   CO2 30 19 - 32 mEq/L   Glucose, Bld 82 70 - 99 mg/dL   BUN 6 6 - 23 mg/dL   Creatinine, Ser 0.67 0.40 - 1.20 mg/dL   Total Bilirubin 0.3 0.2 - 1.2 mg/dL   Alkaline Phosphatase 63 39 - 117 U/L   AST 13 0 - 37 U/L   ALT 8 0 - 35 U/L   Total Protein 7.5 6.0 - 8.3 g/dL   Albumin 4.6 3.5 - 5.2 g/dL   GFR 104.68 >60.00 mL/min   Calcium 9.1 8.4 - 10.5 mg/dL  Hemoglobin  A1c  Result Value Ref Range   Hgb A1c MFr Bld 5.5 4.6 - 6.5 %  Lipid panel  Result Value Ref Range   Cholesterol 192 0 - 200 mg/dL   Triglycerides 92.0 0.0 - 149.0 mg/dL   HDL  66.90 >39.00 mg/dL   VLDL 18.4 0.0 - 40.0 mg/dL   LDL Cholesterol 107 (H) 0 - 99 mg/dL   Total CHOL/HDL Ratio 3    NonHDL 125.38   TSH  Result Value Ref Range   TSH 1.37 0.35 - 5.50 uIU/mL  VITAMIN D 25 Hydroxy (Vit-D Deficiency, Fractures)  Result Value Ref Range   VITD 23.71 (L) 30.00 - 100.00 ng/mL

## 2021-10-01 ENCOUNTER — Ambulatory Visit: Payer: BLUE CROSS/BLUE SHIELD | Admitting: Family Medicine

## 2021-10-01 VITALS — BP 134/80 | HR 88 | Temp 98.2°F | Resp 18 | Ht 65.0 in | Wt 146.2 lb

## 2021-10-01 DIAGNOSIS — Z13220 Encounter for screening for lipoid disorders: Secondary | ICD-10-CM | POA: Diagnosis not present

## 2021-10-01 DIAGNOSIS — R5383 Other fatigue: Secondary | ICD-10-CM | POA: Diagnosis not present

## 2021-10-01 DIAGNOSIS — G47419 Narcolepsy without cataplexy: Secondary | ICD-10-CM | POA: Diagnosis not present

## 2021-10-01 DIAGNOSIS — Z130 Encounter for screening for diseases of the blood and blood-forming organs and certain disorders involving the immune mechanism: Secondary | ICD-10-CM | POA: Diagnosis not present

## 2021-10-01 DIAGNOSIS — B9789 Other viral agents as the cause of diseases classified elsewhere: Secondary | ICD-10-CM | POA: Diagnosis not present

## 2021-10-01 DIAGNOSIS — Z1211 Encounter for screening for malignant neoplasm of colon: Secondary | ICD-10-CM

## 2021-10-01 DIAGNOSIS — Z131 Encounter for screening for diabetes mellitus: Secondary | ICD-10-CM | POA: Diagnosis not present

## 2021-10-01 DIAGNOSIS — J019 Acute sinusitis, unspecified: Secondary | ICD-10-CM | POA: Diagnosis not present

## 2021-10-01 NOTE — Patient Instructions (Signed)
It was good to see you today- I will be in touch with your labs I continue to encourage routine cancer screening and immunizations As a frame of reference:   A single chest x-ray exposes the patient to about 0.1 mSv. This is about the same amount of radiation people are exposed to naturally over the course of about 10 days. A mammogram exposes a woman to 0.4 mSv, or about the amount a person would expect to get from natural background exposure over 7 weeks.  We ordered your Cologuard kit- let me know if this does not arrive at your home in the next couple of weeks

## 2021-10-02 ENCOUNTER — Encounter: Payer: Self-pay | Admitting: Family Medicine

## 2021-10-02 LAB — CBC
HCT: 38.8 % (ref 36.0–46.0)
Hemoglobin: 12.8 g/dL (ref 12.0–15.0)
MCHC: 33 g/dL (ref 30.0–36.0)
MCV: 91.7 fl (ref 78.0–100.0)
Platelets: 292 10*3/uL (ref 150.0–400.0)
RBC: 4.23 Mil/uL (ref 3.87–5.11)
RDW: 14 % (ref 11.5–15.5)
WBC: 5 10*3/uL (ref 4.0–10.5)

## 2021-10-02 LAB — COMPREHENSIVE METABOLIC PANEL
ALT: 8 U/L (ref 0–35)
AST: 13 U/L (ref 0–37)
Albumin: 4.6 g/dL (ref 3.5–5.2)
Alkaline Phosphatase: 63 U/L (ref 39–117)
BUN: 6 mg/dL (ref 6–23)
CO2: 30 mEq/L (ref 19–32)
Calcium: 9.1 mg/dL (ref 8.4–10.5)
Chloride: 100 mEq/L (ref 96–112)
Creatinine, Ser: 0.67 mg/dL (ref 0.40–1.20)
GFR: 104.68 mL/min (ref >60.00)
Glucose, Bld: 82 mg/dL (ref 70–99)
Potassium: 4.2 mEq/L (ref 3.5–5.1)
Sodium: 138 mEq/L (ref 135–145)
Total Bilirubin: 0.3 mg/dL (ref 0.2–1.2)
Total Protein: 7.5 g/dL (ref 6.0–8.3)

## 2021-10-02 LAB — LIPID PANEL
Cholesterol: 192 mg/dL (ref 0–200)
HDL: 66.9 mg/dL (ref >39.00)
LDL Cholesterol: 107 mg/dL — ABNORMAL HIGH (ref 0–99)
NonHDL: 125.38
Total CHOL/HDL Ratio: 3
Triglycerides: 92 mg/dL (ref 0.0–149.0)
VLDL: 18.4 mg/dL (ref 0.0–40.0)

## 2021-10-02 LAB — VITAMIN D 25 HYDROXY (VIT D DEFICIENCY, FRACTURES): VITD: 23.71 ng/mL — ABNORMAL LOW (ref 30.00–100.00)

## 2021-10-02 LAB — HEMOGLOBIN A1C: Hgb A1c MFr Bld: 5.5 % (ref 4.6–6.5)

## 2021-10-02 LAB — TSH: TSH: 1.37 u[IU]/mL (ref 0.35–5.50)

## 2021-10-05 DIAGNOSIS — G47429 Narcolepsy in conditions classified elsewhere without cataplexy: Secondary | ICD-10-CM | POA: Insufficient documentation

## 2021-10-05 DIAGNOSIS — G2581 Restless legs syndrome: Secondary | ICD-10-CM | POA: Insufficient documentation

## 2021-10-22 ENCOUNTER — Other Ambulatory Visit: Payer: Self-pay | Admitting: Family Medicine

## 2021-10-22 NOTE — Telephone Encounter (Signed)
Patient is requesting a refill of the following medications: Requested Prescriptions   Pending Prescriptions Disp Refills   ALPRAZolam (XANAX) 0.25 MG tablet [Pharmacy Med Name: ALPRAZOLAM 0.25 MG TABLET] 45 tablet 0    Sig: TAKE 1 TABLET BY MOUTH 2 TIMES DAILY AS NEEDED FOR ANXIETY. TIME FOR OFFICE VISIT PLEASE    Date of patient request: 10/22/21 Last office visit: 10/01/21 Date of last refill: 09/24/21 Last refill amount: 45+ 0 Follow up time period per chart: none yet

## 2021-12-21 ENCOUNTER — Other Ambulatory Visit: Payer: Self-pay | Admitting: Adult Health

## 2021-12-21 ENCOUNTER — Other Ambulatory Visit: Payer: Self-pay | Admitting: Family Medicine

## 2021-12-25 NOTE — Telephone Encounter (Signed)
Patient needs to see [place] Provider within 30 days.  ?

## 2021-12-25 NOTE — Telephone Encounter (Signed)
Last visit: 09-26-21 ?Next visit: Not scheduled  (mychart message sent to patient) ?Per Reader registry, last filled #30/30 on 11-20-21 ?

## 2022-03-11 DIAGNOSIS — S93492A Sprain of other ligament of left ankle, initial encounter: Secondary | ICD-10-CM | POA: Diagnosis not present

## 2022-03-15 DIAGNOSIS — R52 Pain, unspecified: Secondary | ICD-10-CM | POA: Diagnosis not present

## 2022-03-15 DIAGNOSIS — M25572 Pain in left ankle and joints of left foot: Secondary | ICD-10-CM | POA: Diagnosis not present

## 2022-03-15 DIAGNOSIS — M25872 Other specified joint disorders, left ankle and foot: Secondary | ICD-10-CM | POA: Diagnosis not present

## 2022-03-15 DIAGNOSIS — M25372 Other instability, left ankle: Secondary | ICD-10-CM | POA: Diagnosis not present

## 2022-03-26 ENCOUNTER — Encounter: Payer: Self-pay | Admitting: Family Medicine

## 2022-03-26 DIAGNOSIS — G8929 Other chronic pain: Secondary | ICD-10-CM | POA: Diagnosis not present

## 2022-03-26 DIAGNOSIS — M25372 Other instability, left ankle: Secondary | ICD-10-CM | POA: Diagnosis not present

## 2022-03-26 DIAGNOSIS — M25572 Pain in left ankle and joints of left foot: Secondary | ICD-10-CM | POA: Diagnosis not present

## 2022-03-26 DIAGNOSIS — M25674 Stiffness of right foot, not elsewhere classified: Secondary | ICD-10-CM | POA: Diagnosis not present

## 2022-03-26 MED ORDER — ALPRAZOLAM 0.5 MG PO TABS: 0.2500 mg | ORAL_TABLET | Freq: Two times a day (BID) | ORAL | 0 refills | Status: DC | PRN

## 2022-04-01 DIAGNOSIS — G8929 Other chronic pain: Secondary | ICD-10-CM | POA: Diagnosis not present

## 2022-04-01 DIAGNOSIS — M25674 Stiffness of right foot, not elsewhere classified: Secondary | ICD-10-CM | POA: Diagnosis not present

## 2022-04-01 DIAGNOSIS — M25572 Pain in left ankle and joints of left foot: Secondary | ICD-10-CM | POA: Diagnosis not present

## 2022-04-01 DIAGNOSIS — M25372 Other instability, left ankle: Secondary | ICD-10-CM | POA: Diagnosis not present

## 2022-04-04 DIAGNOSIS — M25572 Pain in left ankle and joints of left foot: Secondary | ICD-10-CM | POA: Diagnosis not present

## 2022-04-04 DIAGNOSIS — M25674 Stiffness of right foot, not elsewhere classified: Secondary | ICD-10-CM | POA: Diagnosis not present

## 2022-04-04 DIAGNOSIS — M25372 Other instability, left ankle: Secondary | ICD-10-CM | POA: Diagnosis not present

## 2022-04-04 DIAGNOSIS — G8929 Other chronic pain: Secondary | ICD-10-CM | POA: Diagnosis not present

## 2022-04-08 DIAGNOSIS — M25372 Other instability, left ankle: Secondary | ICD-10-CM | POA: Diagnosis not present

## 2022-04-08 DIAGNOSIS — M25674 Stiffness of right foot, not elsewhere classified: Secondary | ICD-10-CM | POA: Diagnosis not present

## 2022-04-08 DIAGNOSIS — M25572 Pain in left ankle and joints of left foot: Secondary | ICD-10-CM | POA: Diagnosis not present

## 2022-04-08 DIAGNOSIS — G8929 Other chronic pain: Secondary | ICD-10-CM | POA: Diagnosis not present

## 2022-04-10 DIAGNOSIS — M25674 Stiffness of right foot, not elsewhere classified: Secondary | ICD-10-CM | POA: Diagnosis not present

## 2022-04-10 DIAGNOSIS — M25372 Other instability, left ankle: Secondary | ICD-10-CM | POA: Diagnosis not present

## 2022-04-10 DIAGNOSIS — G8929 Other chronic pain: Secondary | ICD-10-CM | POA: Diagnosis not present

## 2022-04-10 DIAGNOSIS — M25572 Pain in left ankle and joints of left foot: Secondary | ICD-10-CM | POA: Diagnosis not present

## 2022-04-15 DIAGNOSIS — M25572 Pain in left ankle and joints of left foot: Secondary | ICD-10-CM | POA: Diagnosis not present

## 2022-04-15 DIAGNOSIS — G8929 Other chronic pain: Secondary | ICD-10-CM | POA: Diagnosis not present

## 2022-04-15 DIAGNOSIS — M25674 Stiffness of right foot, not elsewhere classified: Secondary | ICD-10-CM | POA: Diagnosis not present

## 2022-04-15 DIAGNOSIS — M25372 Other instability, left ankle: Secondary | ICD-10-CM | POA: Diagnosis not present

## 2022-04-17 ENCOUNTER — Other Ambulatory Visit: Payer: Self-pay | Admitting: Family Medicine

## 2022-04-17 ENCOUNTER — Encounter: Payer: Self-pay | Admitting: Family Medicine

## 2022-04-17 DIAGNOSIS — M25674 Stiffness of right foot, not elsewhere classified: Secondary | ICD-10-CM | POA: Diagnosis not present

## 2022-04-17 DIAGNOSIS — G8929 Other chronic pain: Secondary | ICD-10-CM | POA: Diagnosis not present

## 2022-04-17 DIAGNOSIS — M25372 Other instability, left ankle: Secondary | ICD-10-CM | POA: Diagnosis not present

## 2022-04-17 DIAGNOSIS — M25572 Pain in left ankle and joints of left foot: Secondary | ICD-10-CM | POA: Diagnosis not present

## 2022-04-18 DIAGNOSIS — H1045 Other chronic allergic conjunctivitis: Secondary | ICD-10-CM | POA: Diagnosis not present

## 2022-04-25 ENCOUNTER — Other Ambulatory Visit: Payer: Self-pay | Admitting: Family Medicine

## 2022-04-26 MED ORDER — ALPRAZOLAM 0.5 MG PO TABS: 0.2500 mg | ORAL_TABLET | Freq: Two times a day (BID) | ORAL | 0 refills | Status: DC | PRN

## 2022-05-08 DIAGNOSIS — G8929 Other chronic pain: Secondary | ICD-10-CM | POA: Diagnosis not present

## 2022-05-08 DIAGNOSIS — M25674 Stiffness of right foot, not elsewhere classified: Secondary | ICD-10-CM | POA: Diagnosis not present

## 2022-05-08 DIAGNOSIS — M25372 Other instability, left ankle: Secondary | ICD-10-CM | POA: Diagnosis not present

## 2022-05-08 DIAGNOSIS — M25572 Pain in left ankle and joints of left foot: Secondary | ICD-10-CM | POA: Diagnosis not present

## 2022-05-10 ENCOUNTER — Encounter: Payer: Self-pay | Admitting: Neurology

## 2022-05-13 ENCOUNTER — Encounter: Payer: Self-pay | Admitting: Family Medicine

## 2022-05-13 MED ORDER — SUNOSI 150 MG PO TABS: 1.0000 | ORAL_TABLET | Freq: Every day | ORAL | 0 refills | Status: DC

## 2022-05-13 MED ORDER — ALPRAZOLAM 0.5 MG PO TABS: 0.2500 mg | ORAL_TABLET | Freq: Two times a day (BID) | ORAL | 0 refills | Status: DC | PRN

## 2022-05-15 ENCOUNTER — Encounter: Payer: Self-pay | Admitting: Neurology

## 2022-05-15 ENCOUNTER — Telehealth (INDEPENDENT_AMBULATORY_CARE_PROVIDER_SITE_OTHER): Payer: BLUE CROSS/BLUE SHIELD | Admitting: Neurology

## 2022-05-15 DIAGNOSIS — G47419 Narcolepsy without cataplexy: Secondary | ICD-10-CM

## 2022-05-15 DIAGNOSIS — G47429 Narcolepsy in conditions classified elsewhere without cataplexy: Secondary | ICD-10-CM | POA: Diagnosis not present

## 2022-05-15 DIAGNOSIS — G2581 Restless legs syndrome: Secondary | ICD-10-CM

## 2022-05-15 MED ORDER — AMPHETAMINE-DEXTROAMPHETAMINE 10 MG PO TABS: 10.0000 mg | ORAL_TABLET | Freq: Every day | ORAL | 0 refills | Status: DC

## 2022-05-15 NOTE — Progress Notes (Signed)
Virtual Visit via Video Note  I connected with [facility] on 05/15/22 at 10:00 AM EDT by a video enabled telemedicine application and verified that I am speaking with the correct person using two identifiers.  Location: Patient: at home  Provider: at [facility]    I discussed the limitations of evaluation and management by telemedicine and the availability of in person appointments. The patient expressed understanding and agreed to proceed.  History of Present Illness:nterval history : 09/26/21:  " My brain is racing, I have some insomnia, and I have some RLS/ PLM. I am doing Sunosi  daily and Modafinil prn now. Adderall was making me cranky, snappy plus I experienced a drop off after the dose wore off. " Wants to discuss getting back on mirapex, she stopped this d/t potential interaction w/ lexapro but she is no longer on lexapro.    MM:  Elizabeth [name] is a 46 year old female with a history of narcolepsy.  She returns today for follow-up.  She reports that she continues on Sunosi 150 mg daily.  This is being prescribed by Dr. Lorelei Pont.  She states that she takes modafinil 100 mg daily.  She states that there has medications that she will take 2 tablets.  She takes Adderall 10 mg daily but typically only takes this if she has to drive for a long distance or has a busy day.  She states that the combination is working well for her.  She reports that she does note some twitching in her toes throughout the day and at night.  In the past she has been on Mirapex.  Her sleep study did show PLM.  She returns today for an evaluation.  09/27/20: Elizabeth [name] is a 47 year old female with a history of narcolepsy.  She returns today for follow-up.  She was started on Sunosi 150 mg daily.  She reports that this was beneficial initially.  She states that now the medication typically wears off by 3 PM.  She states that sometimes this is dependent on her caffeine intake as well.  She also notices that her sleepiness is  triggered by the weather.  She states on a sunny day she feels that she has more energy rather than a cloudy day she feels that she is more tired.  She denies depression.  Returns today for an evaluation.  In the past has tried Adderall, Xyrem, nuvigil, provigil  HISTORY 07/14/2019. Chief concern according to patient : pt comes today as a Second opinion visit  with Dr Brett Fairy for sleep consult. pt alone, rm 10. pt was diagnosed here 2 yrs ago she has tried and failed Adderall, Xyrem, Nuvigil and currently taking Provigil.    I have the pleasure of seeing Elizabeth [name] today, a 30 -handed White or Caucasian female with a possible sleep disorder.  She  has a past medical history of Allergy, Anemia, Anxiety state (02/15/2016), Arthritis, Depression, Narcolepsy, and PLMD (periodic limb movement disorder)..  She had 2 pregnancies, and has 2 living daughters.  [name] was 2 weeks late, c -section. [name] was a c section at full term.   She had a surgical removal of a neuroma on 6/18-2019.  She is almost choked by tears as she tells me her story.  She also has some chronic back pain sometimes help with Mobic.  She reportedly had some facial tics in 2019 but had suspected that stimulation medication stimulant medication could be the cause.  She was on generic modafinil 2 pills in the morning  and 1 pill in the afternoon and was also taking Adderall instead immediate release once daily.   She was seen last by my esteemed partner at [facility]- Dr. Junie Spencer, MD - on 02 June 2018.  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  Medications in the past have all yielded suboptimal results yet. The patient had undergone a sleep study for the diagnostic polysomnography on 7-29 2018 followed by done an M SLT on 05/19/2017 there was no apnea noted lots of periodic limb movements which did not lead to arousals, no hypoxemia.  The study was followed the next day by an M SLT was  for naps subjectively the patient reported dreaming and 2 out of the 4 naps and was noted to go into REM sleep during these 2 naps.  Sleep latencies were brief 5 minutes, 6 minutes and twice at 3 minutes.  Ms. alone indicates a condition of abnormal sleepiness.   Alone the mean sleep onset time with 2  REM sleep onsets is indicative of narcolepsy.  The patient did not have a genetic testing for HLA markers yet.   Reviewing the M SLT worksheet of fifth nap was counseled because 2 of the previously used for naps yielded low sleep latency and to sleep REM onset.    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   On sunosi-   How likely are you to doze in the following situations: 0 = not likely, 1 = slight chance, 2 = moderate chance, 3 = high chance  Sitting and Reading? 1 Watching Television? 1 Sitting inactive in a public place (theater or meeting)?1 Lying down in the afternoon when circumstances permit?3 Sitting and talking to someone?1 Sitting quietly after lunch without alcohol?3 In a car, while stopped for a few minutes in traffic?0 As a passenger in a car for an hour without a break?1  Total = 11/ 24  Assessment and Plan: we discussed to add adderall as an PRN to the current regimen of Sunosi . She used XR in the past but prefers IR form.  Script will be for 10 mg adderall generic form, IR.  20 pills. No refills.     Follow Up Instructions: Rv in person with Np in 6 months.     I discussed the assessment and treatment plan with the patient. The patient was provided an opportunity to ask questions and all were answered. The patient agreed with the plan and demonstrated an understanding of the instructions.   The patient was advised to call back or seek an in-person evaluation if the symptoms worsen or if the condition fails to improve as anticipated.  I provided 21 minutes of  non-face-to-face time during this encounter.   Larey Seat, MD

## 2022-05-16 ENCOUNTER — Ambulatory Visit: Payer: BLUE CROSS/BLUE SHIELD | Admitting: Neurology

## 2022-05-16 ENCOUNTER — Telehealth: Payer: Self-pay

## 2022-05-16 NOTE — Telephone Encounter (Signed)
PA Initiated and Approved:   Request Reference Number: [id number]. SUNOSI TAB '150MG'$  is approved through 05/17/2023. Your patient may now fill this prescription and it will be covered.

## 2022-05-18 NOTE — Progress Notes (Unsigned)
[place] at [facility] [address], [place], [place] [address] [phone]  Date:  05/20/2022   Name:  Elizabeth [name]   DOB:  [birth year]   MRN:  [id number]  PCP:  Darreld Mclean, MD    Chief Complaint: No chief complaint on file.   History of Present Illness:  Elizabeth [name] is a 46 y.o. very pleasant female patient who presents with the following:  Virtual visit today for medication follow-up Patient with history of narcolepsy, restless legs, anxiety Patient location is home, my location is office.  Patient identity from with 2 factors, she gives consent for virtual visit today.  The patient and myself are present on the call today Our most recent visit together was in December  She is under a bit more stress right now as she is caring for her parents who both live in [place], they are divorced. She is at her mother's place right now- she is back and forth a lot right now Her father is an alcoholic, her mother has dementia and hearing loss  Her long term goal is to get off xanax but for right now she does want to continue using it as it helps her to sleep   I am currently treating her with alprazolam as well as Sunosi for her narcolepsy Anneka notes that her daughters are doing well  She had a virtual visit with Dr. Brett Fairy, neurology last month  Assessment and Plan: we discussed to add adderall as an PRN to the current regimen of Sunosi . She used XR in the past but prefers IR form.  Script will be for 10 mg adderall generic form, IR.  20 pills. No refills.  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   RLS (restless legs syndrome) 10/05/2021    Past Medical History:  Diagnosis Date   Allergy    Anemia    Anxiety state 02/15/2016   Arthritis    Depression    Narcolepsy    PLMD (periodic limb movement disorder)     Past Surgical History:  Procedure  Laterality Date   CESAREAN SECTION     X2   DILATION AND CURETTAGE OF UTERUS     NASAL SEPTUM SURGERY     ROOT CANAL     x 2    Social History   Tobacco Use   Smoking status: Former    Types: Cigarettes    Quit date: 10/21/2004    Years since quitting: 17.5   Smokeless tobacco: Never   Tobacco comments:    quit 11 yrs ago  Vaping Use   Vaping Use: Never used  Substance Use Topics   Alcohol use: No    Alcohol/week: 0.0 standard drinks of alcohol   Drug use: Never    Family History  Problem Relation Age of Onset   Heart disease Mother    Depression Mother    Anxiety disorder Mother    Diabetes Father    Depression Father    Anxiety disorder Father    Stroke Maternal Grandmother    Parkinsonism Paternal Grandmother    Breast cancer Paternal Grandmother    Bladder Cancer Paternal Grandfather     Allergies  Allergen Reactions   Sulfa Antibiotics     Medication list has been reviewed and updated.  Current Outpatient Medications on File Prior to Visit  Medication Sig Dispense Refill   ALPRAZolam (  XANAX) 0.5 MG tablet Take 0.5 tablets (0.25 mg total) by mouth 2 (two) times daily as needed for anxiety. 30 tablet 0   amphetamine-dextroamphetamine (ADDERALL) 10 MG tablet Take 1 tablet (10 mg total) by mouth daily with breakfast. 20 tablet 0   benzonatate (TESSALON) 200 MG capsule Take by mouth.     fluticasone (FLONASE) 50 MCG/ACT nasal spray Place into the nose.     modafinil (PROVIGIL) 200 MG tablet Take 1 tablet (200 mg total) by mouth daily as needed. 30 tablet 5   Multiple Vitamin (MULTIVITAMIN WITH MINERALS) TABS tablet Take 1 tablet by mouth daily.     pramipexole (MIRAPEX) 0.5 MG tablet Take by mouth.     Solriamfetol HCl (SUNOSI) 150 MG TABS Take 1 tablet by mouth daily. 30 tablet 0   No current facility-administered medications on file prior to visit.    Review of Systems:  As per HPI- otherwise negative.   Physical Examination: There were no vitals  filed for this visit. There were no vitals filed for this visit. There is no height or weight on file to calculate BMI. Ideal Body Weight:    Pt observed via video monitor She looks well and her normal self   Assessment and Plan: Primary narcolepsy without cataplexy - Plan: Solriamfetol HCl (SUNOSI) 150 MG TABS  Anxiety state - Plan: ALPRAZolam (XANAX) 0.5 MG tablet  Caregiver stress Refilled Sunosi which she uses for her naroclepsy Refilled her xanax for anxiety and sleep Asked her to see me in person when she is back in town   Signed Lamar Blinks, MD

## 2022-05-20 ENCOUNTER — Telehealth (INDEPENDENT_AMBULATORY_CARE_PROVIDER_SITE_OTHER): Payer: BLUE CROSS/BLUE SHIELD | Admitting: Family Medicine

## 2022-05-20 DIAGNOSIS — F411 Generalized anxiety disorder: Secondary | ICD-10-CM | POA: Diagnosis not present

## 2022-05-20 DIAGNOSIS — Z636 Dependent relative needing care at home: Secondary | ICD-10-CM

## 2022-05-20 DIAGNOSIS — G47419 Narcolepsy without cataplexy: Secondary | ICD-10-CM | POA: Diagnosis not present

## 2022-05-20 MED ORDER — ALPRAZOLAM 0.5 MG PO TABS: 0.2500 mg | ORAL_TABLET | Freq: Two times a day (BID) | ORAL | 2 refills | Status: DC | PRN

## 2022-05-20 MED ORDER — SUNOSI 150 MG PO TABS: 1.0000 | ORAL_TABLET | Freq: Every day | ORAL | 2 refills | Status: DC

## 2022-05-26 ENCOUNTER — Other Ambulatory Visit: Payer: Self-pay | Admitting: Family Medicine

## 2022-05-26 ENCOUNTER — Encounter: Payer: Self-pay | Admitting: Family Medicine

## 2022-05-26 DIAGNOSIS — F411 Generalized anxiety disorder: Secondary | ICD-10-CM

## 2022-05-27 NOTE — Telephone Encounter (Signed)
Okay for refill?  

## 2022-06-14 ENCOUNTER — Encounter: Payer: Self-pay | Admitting: Family Medicine

## 2022-06-14 ENCOUNTER — Other Ambulatory Visit: Payer: Self-pay | Admitting: Family Medicine

## 2022-06-14 DIAGNOSIS — G47419 Narcolepsy without cataplexy: Secondary | ICD-10-CM

## 2022-06-14 MED ORDER — SUNOSI 150 MG PO TABS: 1.0000 | ORAL_TABLET | Freq: Every day | ORAL | 0 refills | Status: DC

## 2022-07-13 ENCOUNTER — Encounter: Payer: Self-pay | Admitting: Family Medicine

## 2022-07-13 DIAGNOSIS — G47419 Narcolepsy without cataplexy: Secondary | ICD-10-CM

## 2022-07-13 MED ORDER — SUNOSI 150 MG PO TABS: 1.0000 | ORAL_TABLET | Freq: Every day | ORAL | 0 refills | Status: DC

## 2022-08-03 ENCOUNTER — Encounter: Payer: Self-pay | Admitting: Family Medicine

## 2022-08-14 ENCOUNTER — Encounter: Payer: Self-pay | Admitting: Family Medicine

## 2022-08-14 DIAGNOSIS — G47419 Narcolepsy without cataplexy: Secondary | ICD-10-CM

## 2022-08-14 MED ORDER — SUNOSI 150 MG PO TABS: 1.0000 | ORAL_TABLET | Freq: Every day | ORAL | 1 refills | Status: DC

## 2022-08-14 NOTE — Telephone Encounter (Signed)
Okay for refill?  

## 2022-10-23 ENCOUNTER — Encounter: Payer: Self-pay | Admitting: Family Medicine

## 2022-10-24 DIAGNOSIS — H5213 Myopia, bilateral: Secondary | ICD-10-CM | POA: Diagnosis not present

## 2022-10-24 DIAGNOSIS — H524 Presbyopia: Secondary | ICD-10-CM | POA: Diagnosis not present

## 2022-10-30 ENCOUNTER — Other Ambulatory Visit: Payer: Self-pay | Admitting: Family Medicine

## 2022-10-30 DIAGNOSIS — F411 Generalized anxiety disorder: Secondary | ICD-10-CM

## 2022-10-31 ENCOUNTER — Encounter: Payer: Self-pay | Admitting: Family Medicine

## 2022-10-31 MED ORDER — ALPRAZOLAM 0.5 MG PO TABS: ORAL_TABLET | ORAL | 1 refills | Status: DC

## 2022-12-09 ENCOUNTER — Other Ambulatory Visit: Payer: Self-pay | Admitting: Family Medicine

## 2022-12-09 ENCOUNTER — Encounter: Payer: Self-pay | Admitting: Family Medicine

## 2022-12-09 DIAGNOSIS — G47419 Narcolepsy without cataplexy: Secondary | ICD-10-CM

## 2022-12-30 NOTE — Progress Notes (Signed)
[place] at [facility] [address] [place], [place] [address] [phone]  Date:  01/01/2023   Name:  Elizabeth [name]   DOB:  [birth year]   MRN:  [id number]  PCP:  Darreld Mclean, MD    Chief Complaint: medication follow up (Concerns/ questions: pt would like to discuss stopping Xanax safely. 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 very pleasant female patient who presents with the following:  Patient seen today for medication follow-up Most recent visit with myself was a virtual visit in July Patient with history of narcolepsy, restless legs, anxiety   Last visit she was caring for her parents who live in [place], they were both in poor health.  This was very stressful.  Still about the same situation - her father is in [place] now and got dx with MAC recently   Dr. 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   RLS (restless legs syndrome) 10/05/2021    Past Medical History:  Diagnosis Date   Allergy    Anemia    Anxiety state 02/15/2016   Arthritis    Depression    Narcolepsy    PLMD (periodic limb movement disorder)     Past Surgical History:  Procedure Laterality Date   CESAREAN SECTION     X2   DILATION AND CURETTAGE OF  UTERUS     NASAL SEPTUM SURGERY     ROOT CANAL     x 2    Social History   Tobacco Use   Smoking status: Former    Types: Cigarettes    Quit date: 10/21/2004    Years since quitting: 18.2   Smokeless tobacco: Never   Tobacco comments:    quit 11 yrs ago  Vaping Use   Vaping Use: Never used  Substance Use Topics   Alcohol use: No    Alcohol/week: 0.0 standard drinks of alcohol   Drug use: Never    Family History  Problem Relation Age of Onset   Heart disease Mother    Depression Mother    Anxiety disorder Mother    Diabetes Father    Depression Father    Anxiety disorder Father    Stroke Maternal Grandmother    Parkinsonism Paternal Grandmother    Breast cancer Paternal Grandmother    Bladder Cancer Paternal Grandfather     Allergies  Allergen Reactions   Sulfa Antibiotics     Medication list has been reviewed and updated.  Current Outpatient Medications on File Prior to Visit  Medication Sig Dispense Refill   ALPRAZolam (XANAX) 0.25 MG tablet Take 0.25 mg by mouth at bedtime as needed for anxiety. Taking 1/2 tab daily.     Solriamfetol HCl (  SUNOSI) 150 MG TABS TAKE 1 TABLET BY MOUTH EVERY DAY 30 tablet 0   No current facility-administered medications on file prior to visit.    Review of Systems:  As per HPI- otherwise negative.   Physical Examination: Vitals:   01/01/23 1410  BP: 118/82  Pulse: 95  Resp: 18  Temp: 97.7 F (36.5 C)  SpO2: 97%   Vitals:   01/01/23 1410  Weight: 138 lb 9.6 oz (62.9 kg)  Height: '5\' 5"'$  (1.651 m)   Body mass index is 23.06 kg/m. Ideal Body Weight: Weight in (lb) to have BMI = 25: 149.9  GEN: no acute distress. Normal weight looks well  HEENT: Atraumatic, Normocephalic.  Ears and Nose: No external deformity. CV: RRR, No M/G/R. No JVD. No thrill. No extra heart sounds. PULM: CTA B, no wheezes, crackles, rhonchi. No retractions. No resp. distress. No accessory muscle use. ABD: S, NT, ND, +BS. No rebound. No  HSM. EXTR: No c/c/e PSYCH: Normally interactive. Conversant.    Assessment and Plan: Thyroid disorder screening - Plan: TSH  Caregiver stress  Anxiety state - Plan: ALPRAZolam (XANAX) 0.5 MG tablet  Screening for hyperlipidemia - Plan: Lipid panel  Screening for diabetes mellitus - Plan: Comprehensive metabolic panel, Hemoglobin A1c  Fatigue, unspecified type - Plan: CBC, TSH, VITAMIN D 25 Hydroxy (Vit-D Deficiency, Fractures)  Vitamin D deficiency - Plan: VITAMIN D 25 Hydroxy (Vit-D Deficiency, Fractures)  Primary narcolepsy without cataplexy - Plan: Solriamfetol HCl (SUNOSI) 150 MG TABS  Pt seen today for recheck Ordered labs in case she would like to do at some point  Refilled her Sonosi  She is working on tapering off her xanax- she has a plan to taker 12.5 mg at bedtime for a few weeks and then try and stop totally Offered age appropriate screening and labs, immunizations   Signed Lamar Blinks, MD

## 2023-01-01 ENCOUNTER — Ambulatory Visit: Payer: BLUE CROSS/BLUE SHIELD | Admitting: Family Medicine

## 2023-01-01 VITALS — BP 118/82 | HR 95 | Temp 97.7°F | Resp 18 | Ht 65.0 in | Wt 138.6 lb

## 2023-01-01 DIAGNOSIS — F411 Generalized anxiety disorder: Secondary | ICD-10-CM | POA: Diagnosis not present

## 2023-01-01 DIAGNOSIS — Z636 Dependent relative needing care at home: Secondary | ICD-10-CM

## 2023-01-01 DIAGNOSIS — Z1329 Encounter for screening for other suspected endocrine disorder: Secondary | ICD-10-CM

## 2023-01-01 DIAGNOSIS — Z131 Encounter for screening for diabetes mellitus: Secondary | ICD-10-CM | POA: Diagnosis not present

## 2023-01-01 DIAGNOSIS — R5383 Other fatigue: Secondary | ICD-10-CM

## 2023-01-01 DIAGNOSIS — E559 Vitamin D deficiency, unspecified: Secondary | ICD-10-CM

## 2023-01-01 DIAGNOSIS — G47419 Narcolepsy without cataplexy: Secondary | ICD-10-CM

## 2023-01-01 DIAGNOSIS — Z13220 Encounter for screening for lipoid disorders: Secondary | ICD-10-CM | POA: Diagnosis not present

## 2023-01-01 MED ORDER — SUNOSI 150 MG PO TABS: 1.0000 | ORAL_TABLET | Freq: Every day | ORAL | 1 refills | Status: AC

## 2023-01-01 MED ORDER — ALPRAZOLAM 0.5 MG PO TABS: ORAL_TABLET | ORAL | 1 refills | Status: AC

## 2023-01-01 NOTE — Patient Instructions (Signed)
Good to see you today!  I ordered "future" labs which can be done if you chose at some point I think your plan to take a 1/4 of the 0.5 alprazolam for a few weeks and then stop is safe,  let me know if you have any concerns!

## 2023-05-08 ENCOUNTER — Telehealth: Payer: Self-pay

## 2023-05-08 NOTE — Telephone Encounter (Signed)
Error

## 2024-05-27 ENCOUNTER — Other Ambulatory Visit: Payer: Self-pay

## 2024-05-27 ENCOUNTER — Encounter: Payer: Self-pay | Admitting: Family Medicine

## 2024-05-27 DIAGNOSIS — Z1211 Encounter for screening for malignant neoplasm of colon: Secondary | ICD-10-CM
